# Patient Record
Sex: Female | Born: 1935 | Race: White | Hispanic: No | Smoking: Never smoker
Health system: Southern US, Community
[De-identification: ages and names within clinical notes are randomized; demographics above are authoritative.]

## PROBLEM LIST (undated history)

## (undated) DIAGNOSIS — K219 Gastro-esophageal reflux disease without esophagitis: Secondary | ICD-10-CM

## (undated) DIAGNOSIS — E785 Hyperlipidemia, unspecified: Secondary | ICD-10-CM

## (undated) DIAGNOSIS — M4850XA Collapsed vertebra, not elsewhere classified, site unspecified, initial encounter for fracture: Secondary | ICD-10-CM

## (undated) DIAGNOSIS — E079 Disorder of thyroid, unspecified: Secondary | ICD-10-CM

## (undated) DIAGNOSIS — I10 Essential (primary) hypertension: Secondary | ICD-10-CM

## (undated) DIAGNOSIS — I872 Venous insufficiency (chronic) (peripheral): Secondary | ICD-10-CM

## (undated) HISTORY — DX: Collapsed vertebra, not elsewhere classified, site unspecified, initial encounter for fracture: M48.50XA

## (undated) HISTORY — DX: Disorder of thyroid, unspecified: E07.9

## (undated) HISTORY — DX: Hyperlipidemia, unspecified: E78.5

## (undated) HISTORY — DX: Essential (primary) hypertension: I10

## (undated) HISTORY — DX: Venous insufficiency (chronic) (peripheral): I87.2

## (undated) HISTORY — DX: Gastro-esophageal reflux disease without esophagitis: K21.9

---

## 1998-07-23 ENCOUNTER — Encounter: Payer: Self-pay | Admitting: Family Medicine

## 1998-07-23 LAB — CONVERTED CEMR LAB

## 1998-09-15 ENCOUNTER — Other Ambulatory Visit: Admission: RE | Admit: 1998-09-15 | Discharge: 1998-09-15 | Payer: Self-pay | Admitting: *Deleted

## 2000-01-29 ENCOUNTER — Encounter: Payer: Self-pay | Admitting: Family Medicine

## 2000-01-29 ENCOUNTER — Encounter: Admission: RE | Admit: 2000-01-29 | Discharge: 2000-01-29 | Payer: Self-pay | Admitting: Family Medicine

## 2000-02-06 ENCOUNTER — Encounter: Payer: Self-pay | Admitting: Family Medicine

## 2000-02-06 ENCOUNTER — Encounter: Admission: RE | Admit: 2000-02-06 | Discharge: 2000-02-06 | Payer: Self-pay | Admitting: Family Medicine

## 2000-02-21 ENCOUNTER — Encounter: Payer: Self-pay | Admitting: Family Medicine

## 2000-02-21 ENCOUNTER — Encounter: Admission: RE | Admit: 2000-02-21 | Discharge: 2000-02-21 | Payer: Self-pay | Admitting: Family Medicine

## 2000-02-26 ENCOUNTER — Encounter: Admission: RE | Admit: 2000-02-26 | Discharge: 2000-04-03 | Payer: Self-pay | Admitting: Family Medicine

## 2000-04-02 ENCOUNTER — Encounter: Payer: Self-pay | Admitting: Neurosurgery

## 2000-04-02 ENCOUNTER — Ambulatory Visit (HOSPITAL_COMMUNITY): Admission: RE | Admit: 2000-04-02 | Discharge: 2000-04-02 | Payer: Self-pay | Admitting: Neurosurgery

## 2000-04-16 ENCOUNTER — Encounter: Payer: Self-pay | Admitting: Neurosurgery

## 2000-04-16 ENCOUNTER — Ambulatory Visit (HOSPITAL_COMMUNITY): Admission: RE | Admit: 2000-04-16 | Discharge: 2000-04-16 | Payer: Self-pay | Admitting: Neurosurgery

## 2000-04-27 ENCOUNTER — Encounter: Admission: RE | Admit: 2000-04-27 | Discharge: 2000-04-27 | Payer: Self-pay | Admitting: Internal Medicine

## 2000-04-27 ENCOUNTER — Encounter: Payer: Self-pay | Admitting: Internal Medicine

## 2000-04-30 ENCOUNTER — Encounter: Payer: Self-pay | Admitting: Internal Medicine

## 2000-04-30 ENCOUNTER — Encounter: Admission: RE | Admit: 2000-04-30 | Discharge: 2000-04-30 | Payer: Self-pay | Admitting: Internal Medicine

## 2000-07-23 HISTORY — PX: FRACTURE SURGERY: SHX138

## 2000-08-05 ENCOUNTER — Ambulatory Visit (HOSPITAL_COMMUNITY): Admission: RE | Admit: 2000-08-05 | Discharge: 2000-08-05 | Payer: Self-pay | Admitting: Neurosurgery

## 2000-08-05 ENCOUNTER — Encounter: Payer: Self-pay | Admitting: Neurosurgery

## 2000-08-12 ENCOUNTER — Encounter: Admission: RE | Admit: 2000-08-12 | Discharge: 2000-11-10 | Payer: Self-pay | Admitting: Internal Medicine

## 2000-09-20 DIAGNOSIS — G894 Chronic pain syndrome: Secondary | ICD-10-CM | POA: Insufficient documentation

## 2000-11-26 ENCOUNTER — Encounter: Admission: RE | Admit: 2000-11-26 | Discharge: 2001-02-24 | Payer: Self-pay | Admitting: Internal Medicine

## 2001-02-25 ENCOUNTER — Encounter: Admission: RE | Admit: 2001-02-25 | Discharge: 2001-05-26 | Payer: Self-pay | Admitting: Internal Medicine

## 2001-03-05 ENCOUNTER — Encounter: Payer: Self-pay | Admitting: Emergency Medicine

## 2001-03-05 ENCOUNTER — Inpatient Hospital Stay (HOSPITAL_COMMUNITY): Admission: EM | Admit: 2001-03-05 | Discharge: 2001-03-12 | Payer: Self-pay | Admitting: Emergency Medicine

## 2001-03-06 ENCOUNTER — Encounter: Payer: Self-pay | Admitting: Internal Medicine

## 2001-03-10 ENCOUNTER — Encounter: Payer: Self-pay | Admitting: Orthopedic Surgery

## 2001-03-12 ENCOUNTER — Inpatient Hospital Stay
Admission: RE | Admit: 2001-03-12 | Discharge: 2001-04-01 | Payer: Self-pay | Admitting: Physical Medicine & Rehabilitation

## 2001-03-17 ENCOUNTER — Ambulatory Visit (HOSPITAL_COMMUNITY)
Admission: RE | Admit: 2001-03-17 | Discharge: 2001-03-17 | Payer: Self-pay | Admitting: Physical Medicine & Rehabilitation

## 2001-03-17 ENCOUNTER — Encounter: Payer: Self-pay | Admitting: Orthopedic Surgery

## 2001-05-29 ENCOUNTER — Encounter: Admission: RE | Admit: 2001-05-29 | Discharge: 2001-08-27 | Payer: Self-pay | Admitting: Internal Medicine

## 2001-09-03 ENCOUNTER — Encounter: Admission: RE | Admit: 2001-09-03 | Discharge: 2001-12-02 | Payer: Self-pay | Admitting: Internal Medicine

## 2001-12-02 ENCOUNTER — Encounter: Admission: RE | Admit: 2001-12-02 | Discharge: 2002-03-02 | Payer: Self-pay | Admitting: Internal Medicine

## 2002-08-04 ENCOUNTER — Encounter: Admission: RE | Admit: 2002-08-04 | Discharge: 2002-08-04 | Payer: Self-pay | Admitting: Family Medicine

## 2002-08-04 ENCOUNTER — Encounter: Payer: Self-pay | Admitting: Family Medicine

## 2003-09-21 DIAGNOSIS — H3530 Unspecified macular degeneration: Secondary | ICD-10-CM | POA: Insufficient documentation

## 2004-04-06 ENCOUNTER — Ambulatory Visit (HOSPITAL_COMMUNITY): Admission: RE | Admit: 2004-04-06 | Discharge: 2004-04-06 | Payer: Self-pay | Admitting: General Surgery

## 2004-06-06 ENCOUNTER — Ambulatory Visit: Payer: Self-pay | Admitting: Family Medicine

## 2004-06-20 ENCOUNTER — Ambulatory Visit: Payer: Self-pay | Admitting: Family Medicine

## 2004-11-28 ENCOUNTER — Ambulatory Visit: Payer: Self-pay | Admitting: Family Medicine

## 2004-12-08 ENCOUNTER — Ambulatory Visit: Payer: Self-pay | Admitting: Family Medicine

## 2005-01-24 ENCOUNTER — Ambulatory Visit: Payer: Self-pay | Admitting: Family Medicine

## 2005-02-08 ENCOUNTER — Ambulatory Visit: Payer: Self-pay | Admitting: Family Medicine

## 2005-06-21 ENCOUNTER — Ambulatory Visit: Payer: Self-pay | Admitting: Family Medicine

## 2005-07-30 ENCOUNTER — Ambulatory Visit: Payer: Self-pay | Admitting: Family Medicine

## 2005-08-08 ENCOUNTER — Ambulatory Visit: Payer: Self-pay | Admitting: Family Medicine

## 2005-08-28 ENCOUNTER — Ambulatory Visit: Payer: Self-pay | Admitting: Family Medicine

## 2005-12-04 ENCOUNTER — Ambulatory Visit: Payer: Self-pay | Admitting: Family Medicine

## 2005-12-28 ENCOUNTER — Ambulatory Visit: Payer: Self-pay | Admitting: Family Medicine

## 2006-01-04 ENCOUNTER — Ambulatory Visit: Payer: Self-pay | Admitting: Family Medicine

## 2006-01-08 ENCOUNTER — Encounter (HOSPITAL_COMMUNITY): Admission: RE | Admit: 2006-01-08 | Discharge: 2006-04-03 | Payer: Self-pay | Admitting: General Surgery

## 2006-02-19 ENCOUNTER — Ambulatory Visit: Payer: Self-pay | Admitting: Family Medicine

## 2006-04-15 ENCOUNTER — Ambulatory Visit: Payer: Self-pay | Admitting: Family Medicine

## 2006-04-17 ENCOUNTER — Encounter: Admission: RE | Admit: 2006-04-17 | Discharge: 2006-04-17 | Payer: Self-pay | Admitting: Family Medicine

## 2006-05-08 ENCOUNTER — Ambulatory Visit: Payer: Self-pay | Admitting: Family Medicine

## 2006-05-08 LAB — CONVERTED CEMR LAB: Hgb A1c MFr Bld: 6.7 %

## 2006-06-04 ENCOUNTER — Ambulatory Visit: Payer: Self-pay | Admitting: Family Medicine

## 2006-08-20 ENCOUNTER — Ambulatory Visit: Payer: Self-pay | Admitting: Family Medicine

## 2006-08-27 ENCOUNTER — Ambulatory Visit: Payer: Self-pay | Admitting: Family Medicine

## 2006-08-28 ENCOUNTER — Encounter: Admission: RE | Admit: 2006-08-28 | Discharge: 2006-08-28 | Payer: Self-pay | Admitting: Family Medicine

## 2006-08-28 LAB — CONVERTED CEMR LAB
Albumin: 3.6 g/dL
BUN: 21 mg/dL
CO2: 32 meq/L
Calcium: 11 mg/dL — ABNORMAL HIGH
Chloride: 103 meq/L
Creatinine, Ser: 0.9 mg/dL
GFR calc Af Amer: 77 mL/min
GFR calc non Af Amer: 64 mL/min
Glucose, Bld: 86 mg/dL
Phosphorus: 3.2 mg/dL
Potassium: 4.7 meq/L
Sodium: 141 meq/L

## 2006-09-04 ENCOUNTER — Ambulatory Visit: Payer: Self-pay | Admitting: Family Medicine

## 2006-09-20 ENCOUNTER — Ambulatory Visit: Payer: Self-pay | Admitting: Family Medicine

## 2006-09-23 ENCOUNTER — Encounter: Admission: RE | Admit: 2006-09-23 | Discharge: 2006-09-23 | Payer: Self-pay | Admitting: Family Medicine

## 2006-09-30 ENCOUNTER — Ambulatory Visit: Payer: Self-pay | Admitting: Family Medicine

## 2006-10-07 ENCOUNTER — Encounter: Payer: Self-pay | Admitting: Family Medicine

## 2006-10-07 DIAGNOSIS — Z8639 Personal history of other endocrine, nutritional and metabolic disease: Secondary | ICD-10-CM | POA: Insufficient documentation

## 2006-10-07 DIAGNOSIS — E785 Hyperlipidemia, unspecified: Secondary | ICD-10-CM

## 2006-10-07 DIAGNOSIS — Z862 Personal history of diseases of the blood and blood-forming organs and certain disorders involving the immune mechanism: Secondary | ICD-10-CM

## 2006-10-07 DIAGNOSIS — I10 Essential (primary) hypertension: Secondary | ICD-10-CM | POA: Insufficient documentation

## 2006-10-07 DIAGNOSIS — K219 Gastro-esophageal reflux disease without esophagitis: Secondary | ICD-10-CM | POA: Insufficient documentation

## 2006-10-07 DIAGNOSIS — E109 Type 1 diabetes mellitus without complications: Secondary | ICD-10-CM | POA: Insufficient documentation

## 2006-10-08 ENCOUNTER — Ambulatory Visit: Payer: Self-pay | Admitting: Family Medicine

## 2006-10-08 LAB — CONVERTED CEMR LAB
AST: 25 units/L (ref 0–37)
Calcium: 10.7 mg/dL — ABNORMAL HIGH (ref 8.4–10.5)
Hgb A1c MFr Bld: 7.1 % — ABNORMAL HIGH (ref 4.6–6.0)
Total CHOL/HDL Ratio: 2.6
VLDL: 13 mg/dL (ref 0–40)

## 2006-12-11 ENCOUNTER — Encounter: Payer: Self-pay | Admitting: Family Medicine

## 2006-12-31 ENCOUNTER — Ambulatory Visit: Payer: Self-pay | Admitting: Family Medicine

## 2006-12-31 DIAGNOSIS — IMO0002 Reserved for concepts with insufficient information to code with codable children: Secondary | ICD-10-CM

## 2006-12-31 DIAGNOSIS — M810 Age-related osteoporosis without current pathological fracture: Secondary | ICD-10-CM | POA: Insufficient documentation

## 2007-01-02 LAB — CONVERTED CEMR LAB: Hgb A1c MFr Bld: 6.5 % — ABNORMAL HIGH (ref 4.6–6.0)

## 2007-01-07 ENCOUNTER — Telehealth (INDEPENDENT_AMBULATORY_CARE_PROVIDER_SITE_OTHER): Payer: Self-pay | Admitting: *Deleted

## 2007-01-10 ENCOUNTER — Telehealth (INDEPENDENT_AMBULATORY_CARE_PROVIDER_SITE_OTHER): Payer: Self-pay | Admitting: Internal Medicine

## 2007-01-10 ENCOUNTER — Ambulatory Visit: Payer: Self-pay | Admitting: Internal Medicine

## 2007-01-10 LAB — CONVERTED CEMR LAB
Epithelial cells, urine: 0 /lpf
Nitrite: NEGATIVE
Protein, U semiquant: NEGATIVE
pH: 6

## 2007-03-28 ENCOUNTER — Telehealth (INDEPENDENT_AMBULATORY_CARE_PROVIDER_SITE_OTHER): Payer: Self-pay | Admitting: *Deleted

## 2007-04-03 ENCOUNTER — Ambulatory Visit: Payer: Self-pay | Admitting: Family Medicine

## 2007-04-04 LAB — CONVERTED CEMR LAB
ALT: 19 units/L (ref 0–35)
Albumin: 3.9 g/dL (ref 3.5–5.2)
BUN: 17 mg/dL (ref 6–23)
CO2: 31 meq/L (ref 19–32)
Calcium: 10.9 mg/dL — ABNORMAL HIGH (ref 8.4–10.5)
Chloride: 105 meq/L (ref 96–112)
Creatinine, Ser: 0.7 mg/dL (ref 0.4–1.2)
Creatinine,U: 53.9 mg/dL
GFR calc Af Amer: 103 mL/min
Microalb Creat Ratio: 13 mg/g (ref 0.0–30.0)
Microalb, Ur: 0.7 mg/dL (ref 0.0–1.9)
Phosphorus: 3.4 mg/dL (ref 2.3–4.6)
Potassium: 4.3 meq/L (ref 3.5–5.1)
Sodium: 141 meq/L (ref 135–145)

## 2007-07-10 ENCOUNTER — Encounter: Payer: Self-pay | Admitting: Family Medicine

## 2007-08-05 ENCOUNTER — Ambulatory Visit: Payer: Self-pay | Admitting: Family Medicine

## 2007-08-06 ENCOUNTER — Telehealth: Payer: Self-pay | Admitting: Family Medicine

## 2007-08-14 ENCOUNTER — Ambulatory Visit: Payer: Self-pay | Admitting: Family Medicine

## 2007-08-15 LAB — CONVERTED CEMR LAB
ALT: 20 units/L (ref 0–35)
Albumin: 4.1 g/dL (ref 3.5–5.2)
Basophils Absolute: 0 10*3/uL (ref 0.0–0.1)
CO2: 31 meq/L (ref 19–32)
Calcium: 11.2 mg/dL — ABNORMAL HIGH (ref 8.4–10.5)
Chloride: 103 meq/L (ref 96–112)
Cholesterol: 149 mg/dL (ref 0–200)
Eosinophils Relative: 2.1 % (ref 0.0–5.0)
GFR calc Af Amer: 103 mL/min
GFR calc non Af Amer: 85 mL/min
Glucose, Bld: 148 mg/dL — ABNORMAL HIGH (ref 70–99)
Hemoglobin: 12.9 g/dL (ref 12.0–15.0)
Hgb A1c MFr Bld: 6.8 % — ABNORMAL HIGH (ref 4.6–6.0)
MCHC: 35.1 g/dL (ref 30.0–36.0)
MCV: 95.6 fL (ref 78.0–100.0)
Neutrophils Relative %: 73.5 % (ref 43.0–77.0)
Sodium: 142 meq/L (ref 135–145)
VLDL: 14 mg/dL (ref 0–40)

## 2007-09-09 ENCOUNTER — Ambulatory Visit: Payer: Self-pay | Admitting: Family Medicine

## 2007-09-24 ENCOUNTER — Telehealth: Payer: Self-pay | Admitting: Family Medicine

## 2007-10-08 ENCOUNTER — Encounter: Payer: Self-pay | Admitting: Family Medicine

## 2007-11-05 ENCOUNTER — Ambulatory Visit: Payer: Self-pay | Admitting: Family Medicine

## 2007-12-03 ENCOUNTER — Telehealth: Payer: Self-pay | Admitting: Family Medicine

## 2007-12-03 ENCOUNTER — Encounter: Admission: RE | Admit: 2007-12-03 | Discharge: 2007-12-03 | Payer: Self-pay | Admitting: Family Medicine

## 2008-01-05 ENCOUNTER — Ambulatory Visit: Payer: Self-pay | Admitting: Family Medicine

## 2008-01-05 DIAGNOSIS — S62109A Fracture of unspecified carpal bone, unspecified wrist, initial encounter for closed fracture: Secondary | ICD-10-CM | POA: Insufficient documentation

## 2008-01-06 LAB — CONVERTED CEMR LAB
ALT: 17 units/L (ref 0–35)
Hgb A1c MFr Bld: 7.1 % — ABNORMAL HIGH (ref 4.6–6.0)

## 2008-01-19 ENCOUNTER — Ambulatory Visit: Payer: Self-pay | Admitting: Family Medicine

## 2008-03-30 ENCOUNTER — Encounter: Payer: Self-pay | Admitting: Family Medicine

## 2008-04-01 ENCOUNTER — Encounter: Payer: Self-pay | Admitting: Family Medicine

## 2008-04-07 ENCOUNTER — Ambulatory Visit: Payer: Self-pay | Admitting: Family Medicine

## 2008-04-12 LAB — CONVERTED CEMR LAB
Albumin: 3.8 g/dL (ref 3.5–5.2)
CO2: 33 meq/L — ABNORMAL HIGH (ref 19–32)
Creatinine,U: 11.7 mg/dL
GFR calc non Af Amer: 85 mL/min
Microalb, Ur: 0.6 mg/dL (ref 0.0–1.9)
Potassium: 3.5 meq/L (ref 3.5–5.1)
Sodium: 139 meq/L (ref 135–145)

## 2008-04-14 ENCOUNTER — Encounter: Payer: Self-pay | Admitting: Family Medicine

## 2008-05-11 ENCOUNTER — Ambulatory Visit: Payer: Self-pay | Admitting: Family Medicine

## 2008-05-13 ENCOUNTER — Encounter: Admission: RE | Admit: 2008-05-13 | Discharge: 2008-05-13 | Payer: Self-pay | Admitting: Family Medicine

## 2008-06-04 ENCOUNTER — Ambulatory Visit: Payer: Self-pay | Admitting: Family Medicine

## 2008-06-14 ENCOUNTER — Telehealth: Payer: Self-pay | Admitting: Family Medicine

## 2008-09-27 ENCOUNTER — Ambulatory Visit: Payer: Self-pay | Admitting: Family Medicine

## 2008-09-29 ENCOUNTER — Ambulatory Visit: Payer: Self-pay | Admitting: Cardiovascular Disease

## 2008-09-30 LAB — CONVERTED CEMR LAB
Alkaline Phosphatase: 60 units/L (ref 39–117)
BUN: 14 mg/dL (ref 6–23)
Calcium: 10.5 mg/dL (ref 8.4–10.5)
Chloride: 102 meq/L (ref 96–112)
Eosinophils Relative: 1.3 % (ref 0.0–5.0)
GFR calc Af Amer: 122 mL/min
Glucose, Bld: 225 mg/dL — ABNORMAL HIGH (ref 70–99)
Hemoglobin: 11.6 g/dL — ABNORMAL LOW (ref 12.0–15.0)
MCV: 91 fL (ref 78.0–100.0)
Monocytes Absolute: 0.2 10*3/uL (ref 0.1–1.0)
Monocytes Relative: 3 % (ref 3.0–12.0)
Neutro Abs: 6 10*3/uL (ref 1.4–7.7)
Potassium: 3.8 meq/L (ref 3.5–5.1)
RDW: 13.8 % (ref 11.5–14.6)
Total Bilirubin: 0.6 mg/dL (ref 0.3–1.2)
WBC: 7.7 10*3/uL (ref 4.5–10.5)

## 2008-10-04 ENCOUNTER — Encounter: Payer: Self-pay | Admitting: Family Medicine

## 2008-10-19 ENCOUNTER — Ambulatory Visit: Payer: Self-pay | Admitting: Family Medicine

## 2008-10-19 LAB — CONVERTED CEMR LAB: OCCULT 1: NEGATIVE

## 2008-10-21 ENCOUNTER — Encounter (INDEPENDENT_AMBULATORY_CARE_PROVIDER_SITE_OTHER): Payer: Self-pay | Admitting: *Deleted

## 2008-10-25 ENCOUNTER — Telehealth: Payer: Self-pay | Admitting: Family Medicine

## 2008-11-30 ENCOUNTER — Ambulatory Visit: Payer: Self-pay | Admitting: Gastroenterology

## 2008-11-30 DIAGNOSIS — K5900 Constipation, unspecified: Secondary | ICD-10-CM | POA: Insufficient documentation

## 2008-11-30 DIAGNOSIS — K902 Blind loop syndrome, not elsewhere classified: Secondary | ICD-10-CM

## 2008-12-01 LAB — CONVERTED CEMR LAB
Ferritin: 51.7 ng/mL (ref 10.0–291.0)
Folate: >20 ng/mL
Iron: 45 ug/dL (ref 42–145)
Saturation Ratios: 14.1 % — ABNORMAL LOW (ref 20.0–50.0)
Transferrin: 228.5 mg/dL (ref 212.0–360.0)
Vitamin B-12: >1500 pg/mL — ABNORMAL HIGH (ref 211–911)

## 2008-12-10 ENCOUNTER — Encounter: Payer: Self-pay | Admitting: Family Medicine

## 2008-12-23 ENCOUNTER — Telehealth: Payer: Self-pay | Admitting: Gastroenterology

## 2009-01-17 ENCOUNTER — Ambulatory Visit: Payer: Self-pay | Admitting: Family Medicine

## 2009-01-17 DIAGNOSIS — Z8669 Personal history of other diseases of the nervous system and sense organs: Secondary | ICD-10-CM | POA: Insufficient documentation

## 2009-01-17 DIAGNOSIS — I872 Venous insufficiency (chronic) (peripheral): Secondary | ICD-10-CM | POA: Insufficient documentation

## 2009-01-21 ENCOUNTER — Telehealth: Payer: Self-pay | Admitting: Family Medicine

## 2009-02-14 ENCOUNTER — Ambulatory Visit: Payer: Self-pay | Admitting: Family Medicine

## 2009-02-14 DIAGNOSIS — R609 Edema, unspecified: Secondary | ICD-10-CM

## 2009-02-15 ENCOUNTER — Telehealth: Payer: Self-pay | Admitting: Family Medicine

## 2009-02-15 ENCOUNTER — Encounter: Admission: RE | Admit: 2009-02-15 | Discharge: 2009-02-15 | Payer: Self-pay | Admitting: Family Medicine

## 2009-02-25 ENCOUNTER — Ambulatory Visit: Payer: Self-pay | Admitting: Family Medicine

## 2009-03-03 ENCOUNTER — Encounter: Payer: Self-pay | Admitting: Family Medicine

## 2009-03-07 ENCOUNTER — Encounter: Admission: RE | Admit: 2009-03-07 | Discharge: 2009-03-07 | Payer: Self-pay | Admitting: Family Medicine

## 2009-03-10 ENCOUNTER — Encounter: Payer: Self-pay | Admitting: Family Medicine

## 2009-03-22 ENCOUNTER — Ambulatory Visit: Payer: Self-pay | Admitting: Family Medicine

## 2009-04-08 ENCOUNTER — Ambulatory Visit: Payer: Self-pay | Admitting: Family Medicine

## 2009-04-11 LAB — CONVERTED CEMR LAB
ALT: 17 units/L (ref 0–35)
AST: 22 units/L (ref 0–37)
Albumin: 3.7 g/dL (ref 3.5–5.2)
Alkaline Phosphatase: 70 units/L (ref 39–117)
BUN: 13 mg/dL (ref 6–23)
Basophils Absolute: 0 10*3/uL (ref 0.0–0.1)
CO2: 35 meq/L — ABNORMAL HIGH (ref 19–32)
Calcium: 11.1 mg/dL — ABNORMAL HIGH (ref 8.4–10.5)
Chloride: 107 meq/L (ref 96–112)
Cholesterol: 128 mg/dL (ref 0–200)
HCT: 35 % — ABNORMAL LOW (ref 36.0–46.0)
Hemoglobin: 11.8 g/dL — ABNORMAL LOW (ref 12.0–15.0)
Hgb A1c MFr Bld: 7.3 % — ABNORMAL HIGH (ref 4.6–6.5)
Neutro Abs: 5.1 10*3/uL (ref 1.4–7.7)
Neutrophils Relative %: 69.3 % (ref 43.0–77.0)
Platelets: 158 10*3/uL (ref 150.0–400.0)
Total Bilirubin: 0.8 mg/dL (ref 0.3–1.2)
Total CHOL/HDL Ratio: 2
VLDL: 9.4 mg/dL (ref 0.0–40.0)
WBC: 7.3 10*3/uL (ref 4.5–10.5)

## 2009-04-13 LAB — CONVERTED CEMR LAB
Creatinine,U: 18.6 mg/dL
Microalb, Ur: 0.1 mg/dL (ref 0.0–1.9)

## 2009-05-09 ENCOUNTER — Ambulatory Visit: Payer: Self-pay | Admitting: Family Medicine

## 2009-05-16 ENCOUNTER — Telehealth: Payer: Self-pay | Admitting: Family Medicine

## 2009-06-08 ENCOUNTER — Encounter: Payer: Self-pay | Admitting: Family Medicine

## 2009-06-13 ENCOUNTER — Ambulatory Visit: Payer: Self-pay | Admitting: Family Medicine

## 2009-06-15 ENCOUNTER — Telehealth: Payer: Self-pay | Admitting: Family Medicine

## 2009-06-28 ENCOUNTER — Ambulatory Visit: Payer: Self-pay | Admitting: Family Medicine

## 2009-06-29 ENCOUNTER — Telehealth: Payer: Self-pay | Admitting: Family Medicine

## 2009-07-01 ENCOUNTER — Ambulatory Visit: Payer: Self-pay | Admitting: Family Medicine

## 2009-07-01 LAB — CONVERTED CEMR LAB
Albumin: 3.6 g/dL (ref 3.5–5.2)
BUN: 12 mg/dL (ref 6–23)
CO2: 34 meq/L — ABNORMAL HIGH (ref 19–32)
Chloride: 101 meq/L (ref 96–112)
Creatinine, Ser: 0.8 mg/dL (ref 0.4–1.2)
GFR calc non Af Amer: 72.16 mL/min (ref >60)
Potassium: 2.8 meq/L — CL (ref 3.5–5.1)

## 2009-09-15 ENCOUNTER — Telehealth: Payer: Self-pay | Admitting: Family Medicine

## 2009-10-14 ENCOUNTER — Ambulatory Visit: Payer: Self-pay | Admitting: Family Medicine

## 2009-10-17 LAB — CONVERTED CEMR LAB
Ferritin: 75 ng/mL (ref 10–291)
Iron: 34 ug/dL — ABNORMAL LOW (ref 42–145)
Lymphocytes Relative: 21 % (ref 12–46)
MCHC: 32.1 g/dL (ref 30.0–36.0)
Platelets: 180 10*3/uL (ref 150–400)
RDW: 14.3 % (ref 11.5–15.5)
WBC: 8 10*3/uL (ref 4.0–10.5)

## 2009-12-28 ENCOUNTER — Ambulatory Visit: Payer: Self-pay | Admitting: Family Medicine

## 2009-12-29 LAB — CONVERTED CEMR LAB: Vit D, 25-Hydroxy: 34 ng/mL (ref 30–89)

## 2010-02-02 ENCOUNTER — Encounter: Payer: Self-pay | Admitting: Family Medicine

## 2010-02-02 LAB — CONVERTED CEMR LAB
ALT: 21 units/L (ref 0–35)
AST: 29 units/L (ref 0–37)
Albumin: 3.9 g/dL (ref 3.5–5.2)
BUN: 17 mg/dL (ref 6–23)
Basophils Absolute: 0 10*3/uL (ref 0.0–0.1)
CO2: 29 meq/L (ref 19–32)
Calcium: 11 mg/dL — ABNORMAL HIGH (ref 8.4–10.5)
Creatinine, Ser: 0.8 mg/dL (ref 0.4–1.2)
Eosinophils Absolute: 0.1 10*3/uL (ref 0.0–0.7)
Eosinophils Relative: 1.3 % (ref 0.0–5.0)
Glucose, Bld: 126 mg/dL — ABNORMAL HIGH (ref 70–99)
HCT: 34.4 % — ABNORMAL LOW (ref 36.0–46.0)
HDL: 57.6 mg/dL (ref >39.00)
Lymphs Abs: 1.3 10*3/uL (ref 0.7–4.0)
MCHC: 33.9 g/dL (ref 30.0–36.0)
MCV: 92.2 fL (ref 78.0–100.0)
Monocytes Absolute: 0.5 10*3/uL (ref 0.1–1.0)
Neutrophils Relative %: 72.1 % (ref 43.0–77.0)
Platelets: 152 10*3/uL (ref 150.0–400.0)
RDW: 15.3 % — ABNORMAL HIGH (ref 11.5–14.6)
Sodium: 144 meq/L (ref 135–145)
Triglycerides: 43 mg/dL (ref 0.0–149.0)
VLDL: 8.6 mg/dL (ref 0.0–40.0)

## 2010-03-15 IMAGING — US US EXTREM LOW VENOUS*L*
1 series · 14 of 24 positions shown · non-contrast
Comparison: None

CLINICAL DATA: Left calf pain.  Venous insufficiency.

LEFT LOWER EXTREMITY VENOUS DUPLEX ULTRASOUND
TECHNIQUE: Gray-scale sonography with graded compression, as well
as color Doppler and duplex ultrasound, were performed to evaluate
the deep venous system of the lower extremity on the left side from
the level of the common femoral vein through the popliteal and
proximal calf veins. Spectral Doppler was utilized to evaluate flow
at rest and with distal augmentation maneuvers.

[Series 1: us extrem low venous*left* · 14 of 34 slices shown]
[im 1/34]
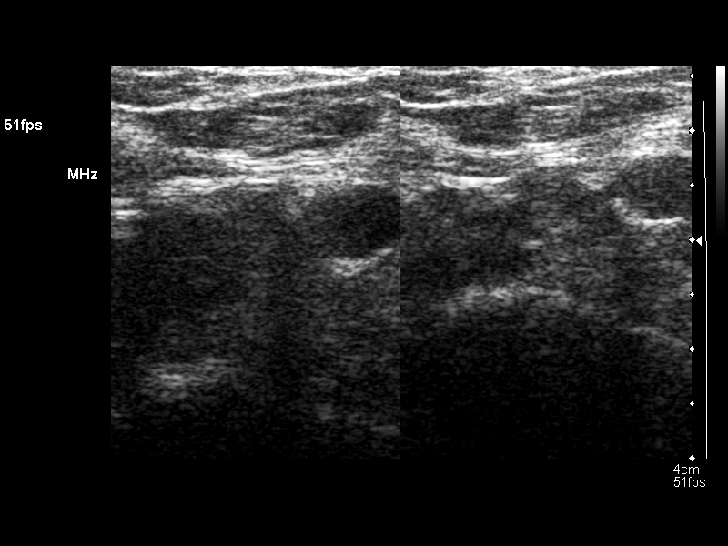
[im 3/34]
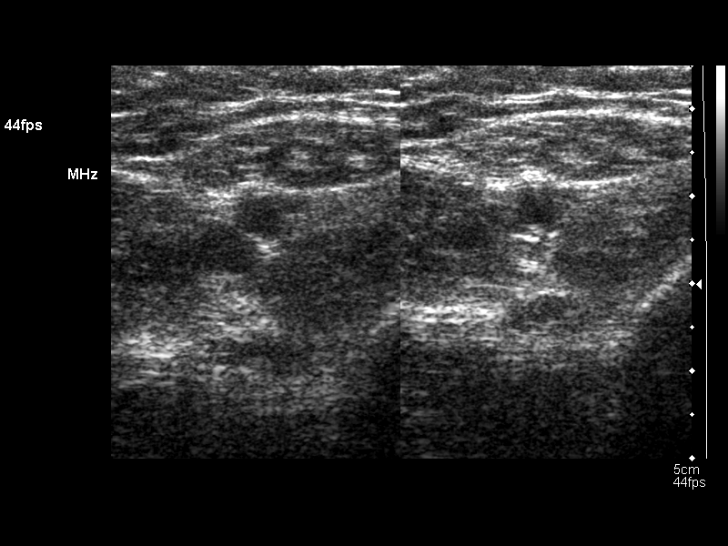
[im 6/34]
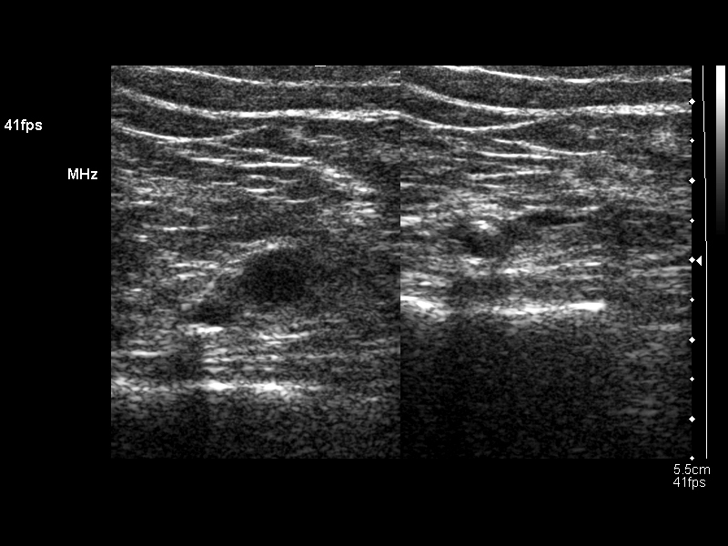
[im 9/34]
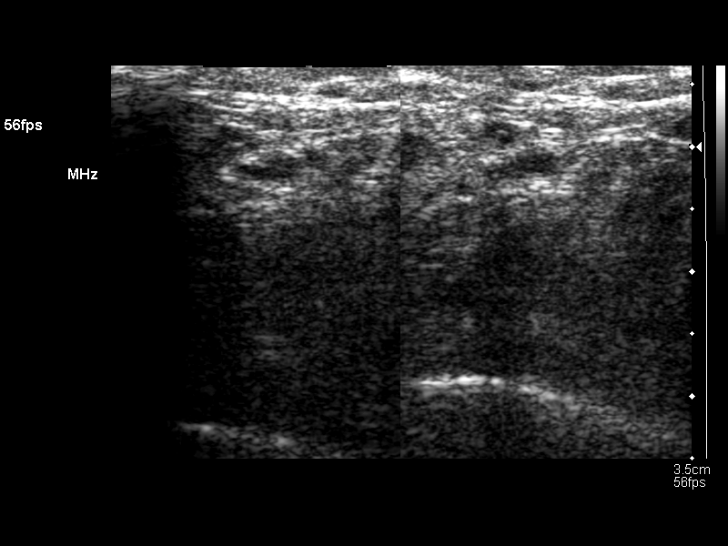
[im 11/34]
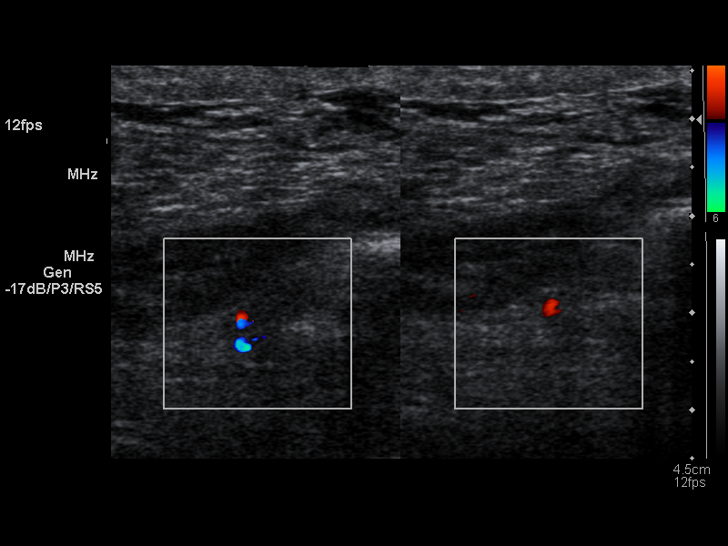
[im 13/34]
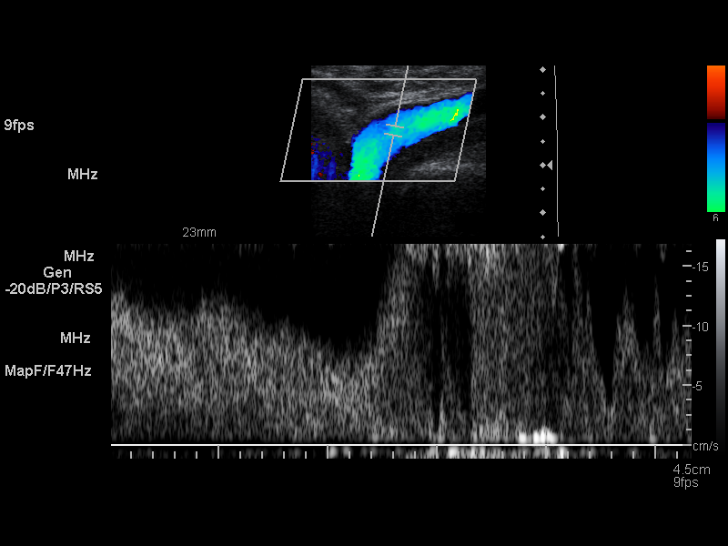
[im 16/34]
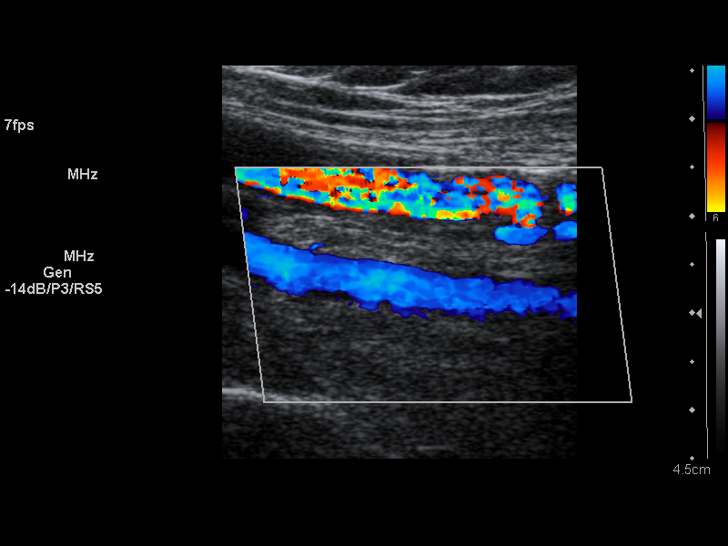
[im 18/34]
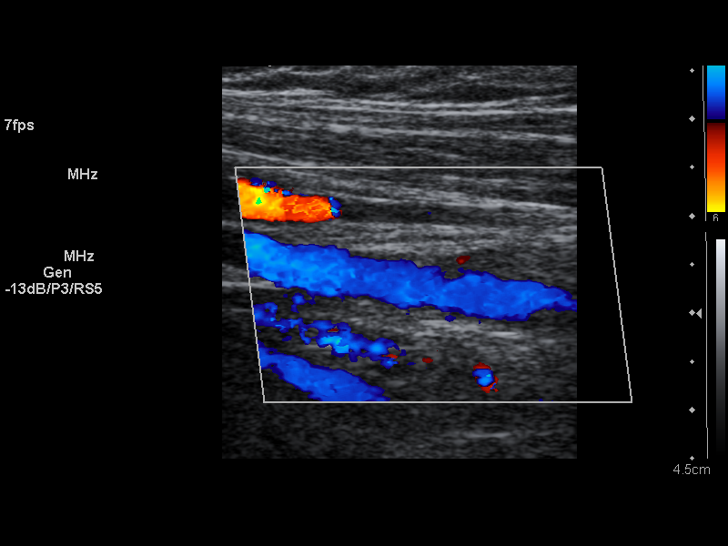
[im 21/34]
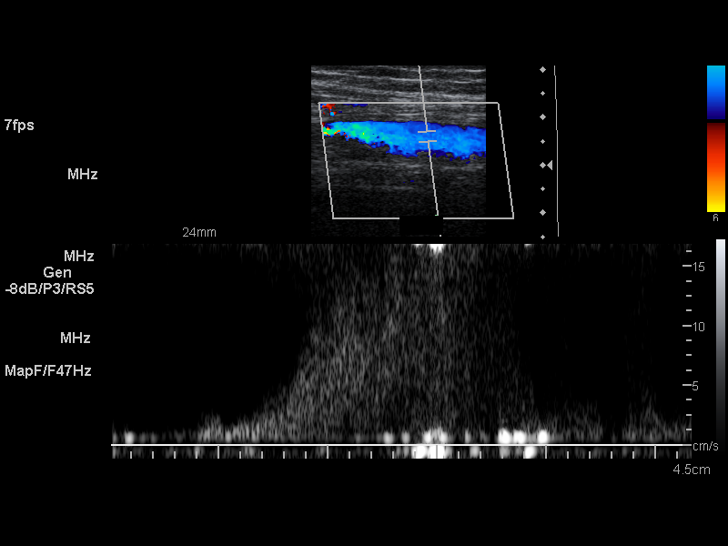
[im 23/34]
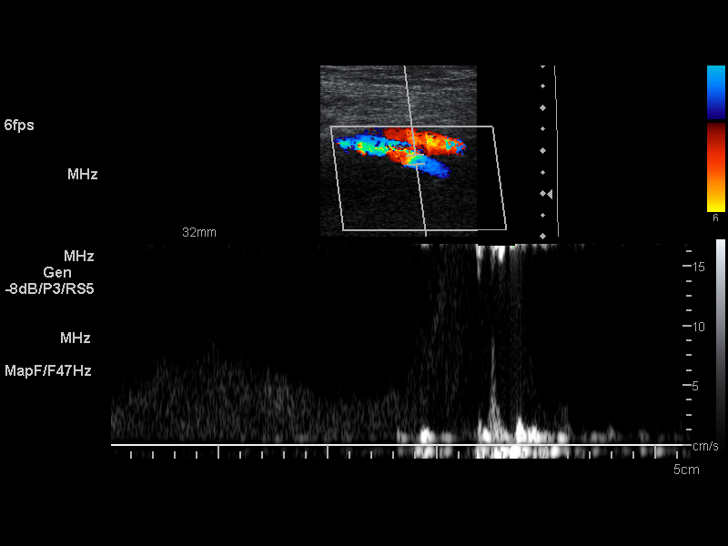
[im 26/34]
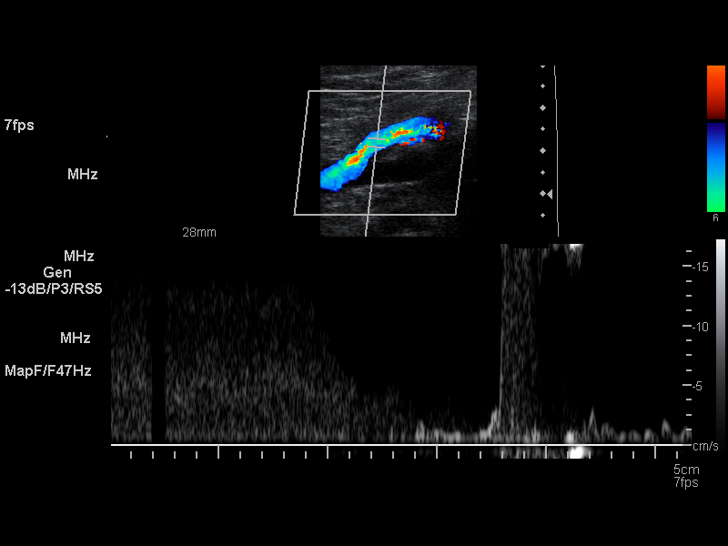
[im 28/34]
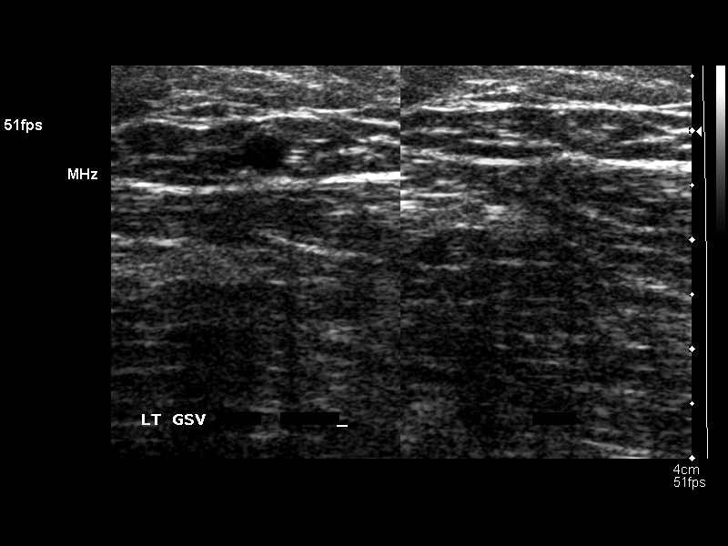
[im 31/34]
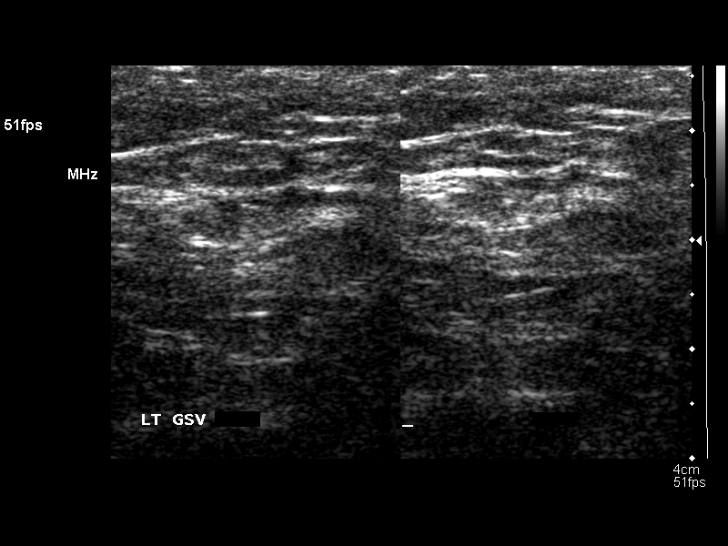
[im 34/34]
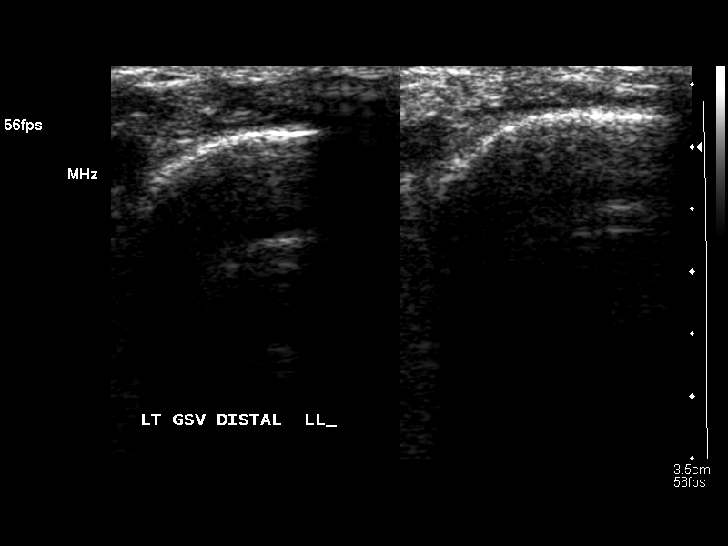

[14 of 24 positions shown; findings below may reference images not displayed]

FINDINGS: Normal compressibility of the left common femoral,
superficial femoral, and popliteal veins is demonstrated, as well
as the visualized proximal calf veins.  No filling defects to
suggest DVT on grayscale or color Doppler imaging.  Doppler
waveforms show normal direction of venous flow, normal respiratory
phasicity and response to augmentation.
IMPRESSION: 1.  No evidence of lower extremity deep vein thrombosis.

## 2010-03-29 ENCOUNTER — Encounter: Payer: Self-pay | Admitting: Family Medicine

## 2010-03-31 ENCOUNTER — Ambulatory Visit: Payer: Self-pay | Admitting: Family Medicine

## 2010-04-04 IMAGING — CR DG TIBIA/FIBULA 2V*L*
2 series · 2 of 2 positions shown · non-contrast
Comparison: None.

CLINICAL DATA: Edema and erythema involving the left leg and foot.
History of diabetes.

LEFT TIBIA AND FIBULA - 2 VIEW 03/07/2009:

[view not recorded (1 of 2)]
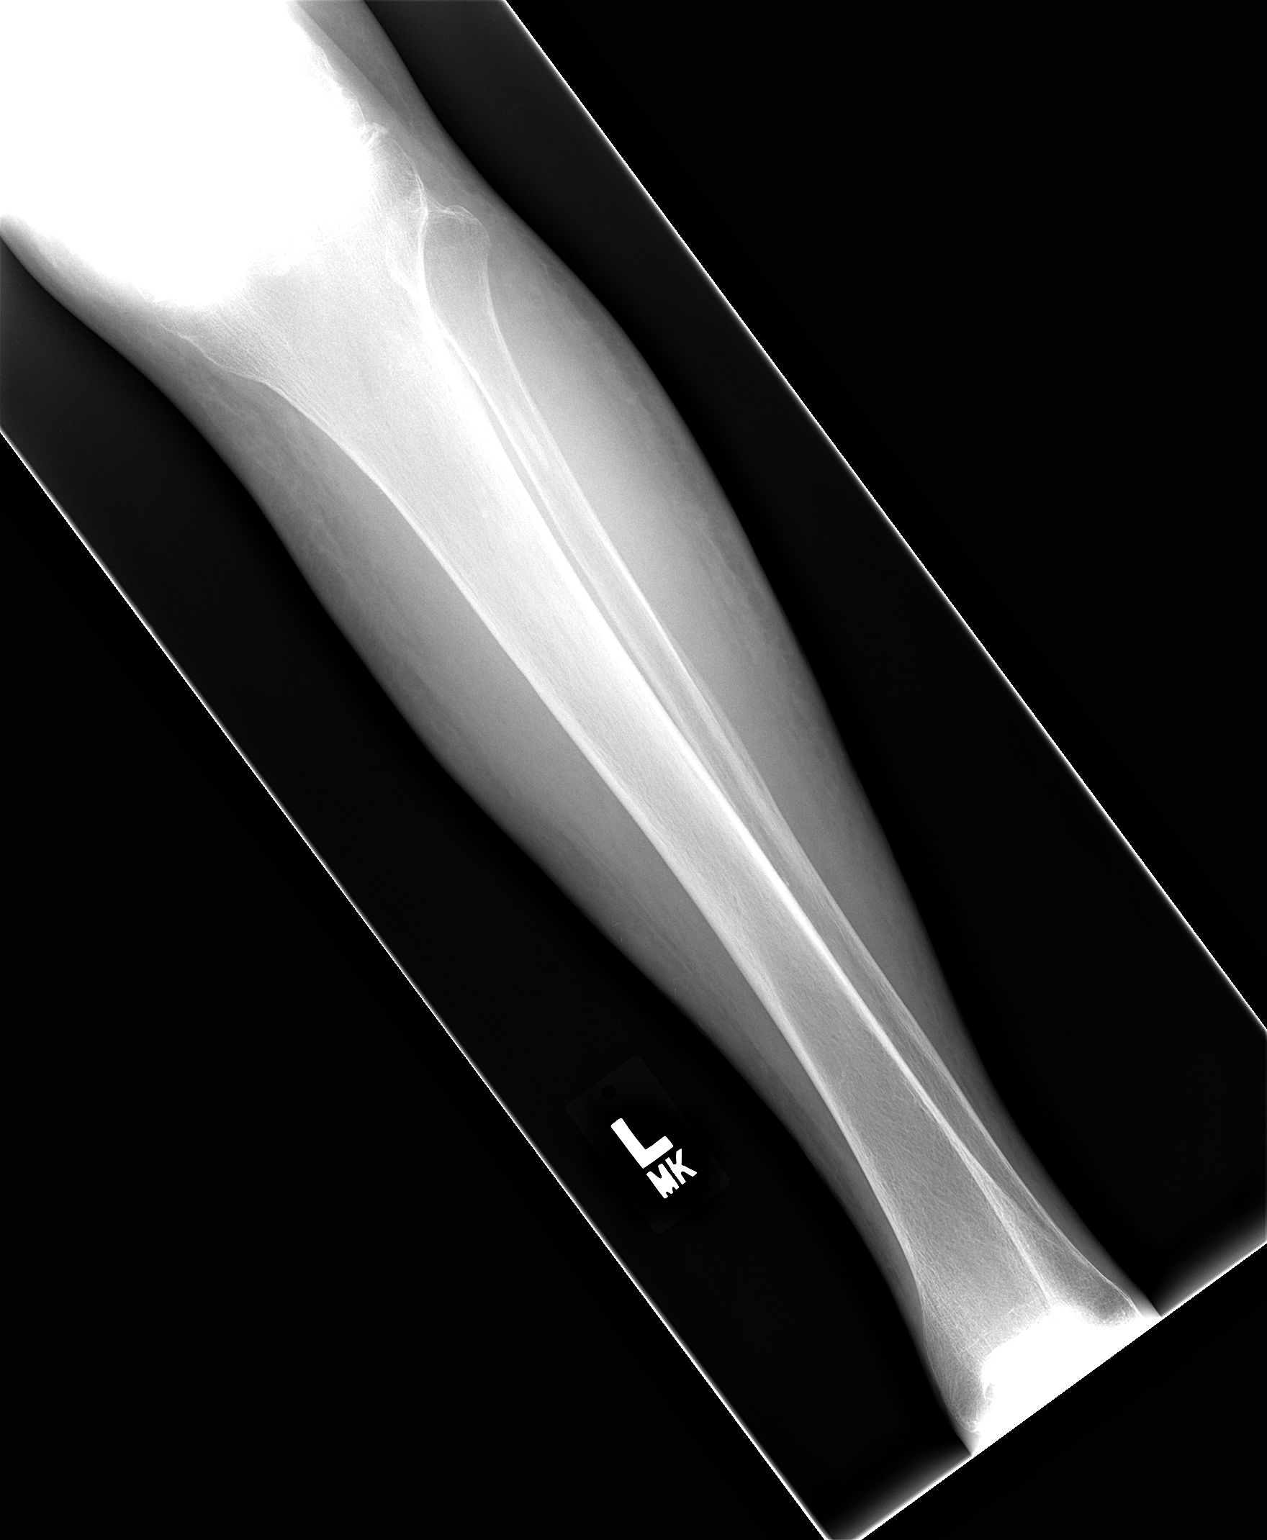

[view not recorded (2 of 2)]
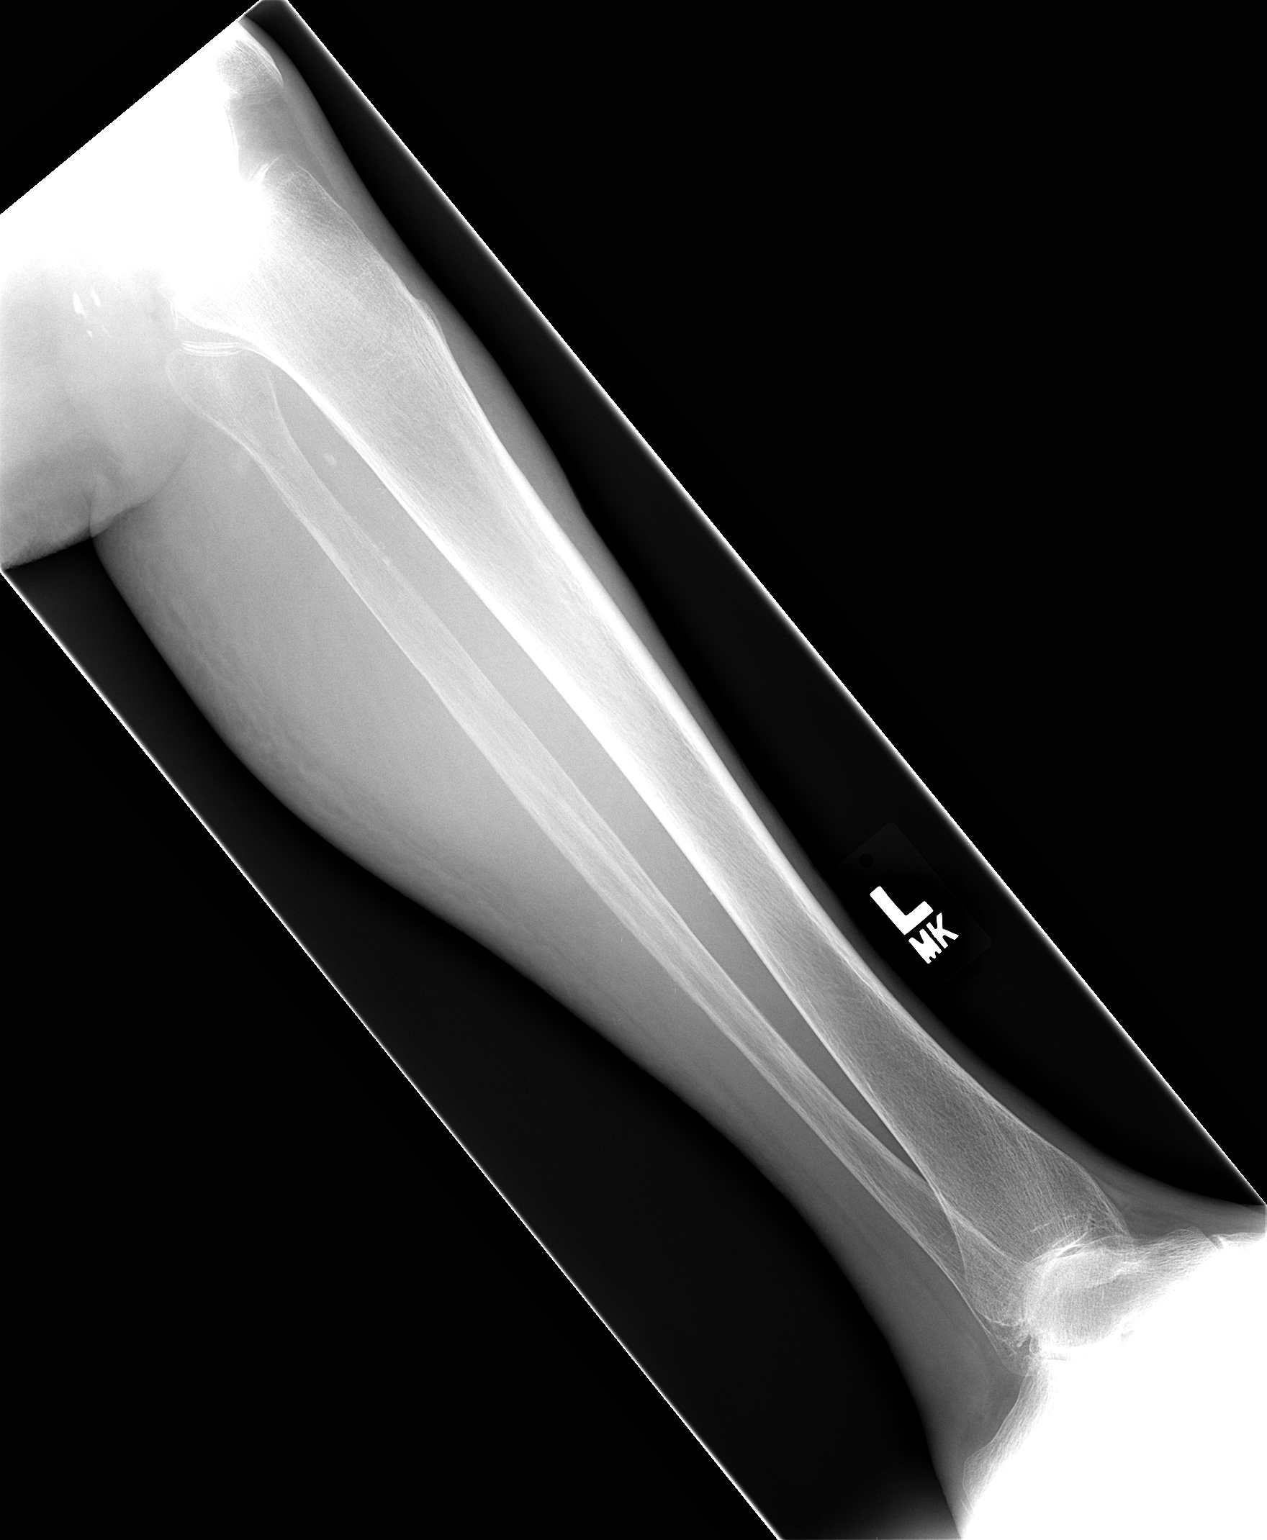

[2 of 2 positions shown; findings below may reference images not displayed]

FINDINGS: Severe generalized osteopenia.  No evidence of acute or
subacute fracture or dislocation.  No other intrinsic osseous
abnormalities.  Phleboliths in the soft tissues.  Moderate to
severe degenerative changes involving the visualized knee joint.
IMPRESSION: No acute skeletal abnormalities.  Severe osteopenia.  Degenerative
changes in the knee joint.

## 2010-04-04 IMAGING — CR DG ANKLE COMPLETE 3+V*L*
3 series · 3 of 3 positions shown · non-contrast
Comparison: None.

CLINICAL DATA: Edema and erythema involving the left leg and foot.
History of diabetes.

LEFT ANKLE COMPLETE - 3+ VIEW 03/07/2009:

[view not recorded (1 of 3)]
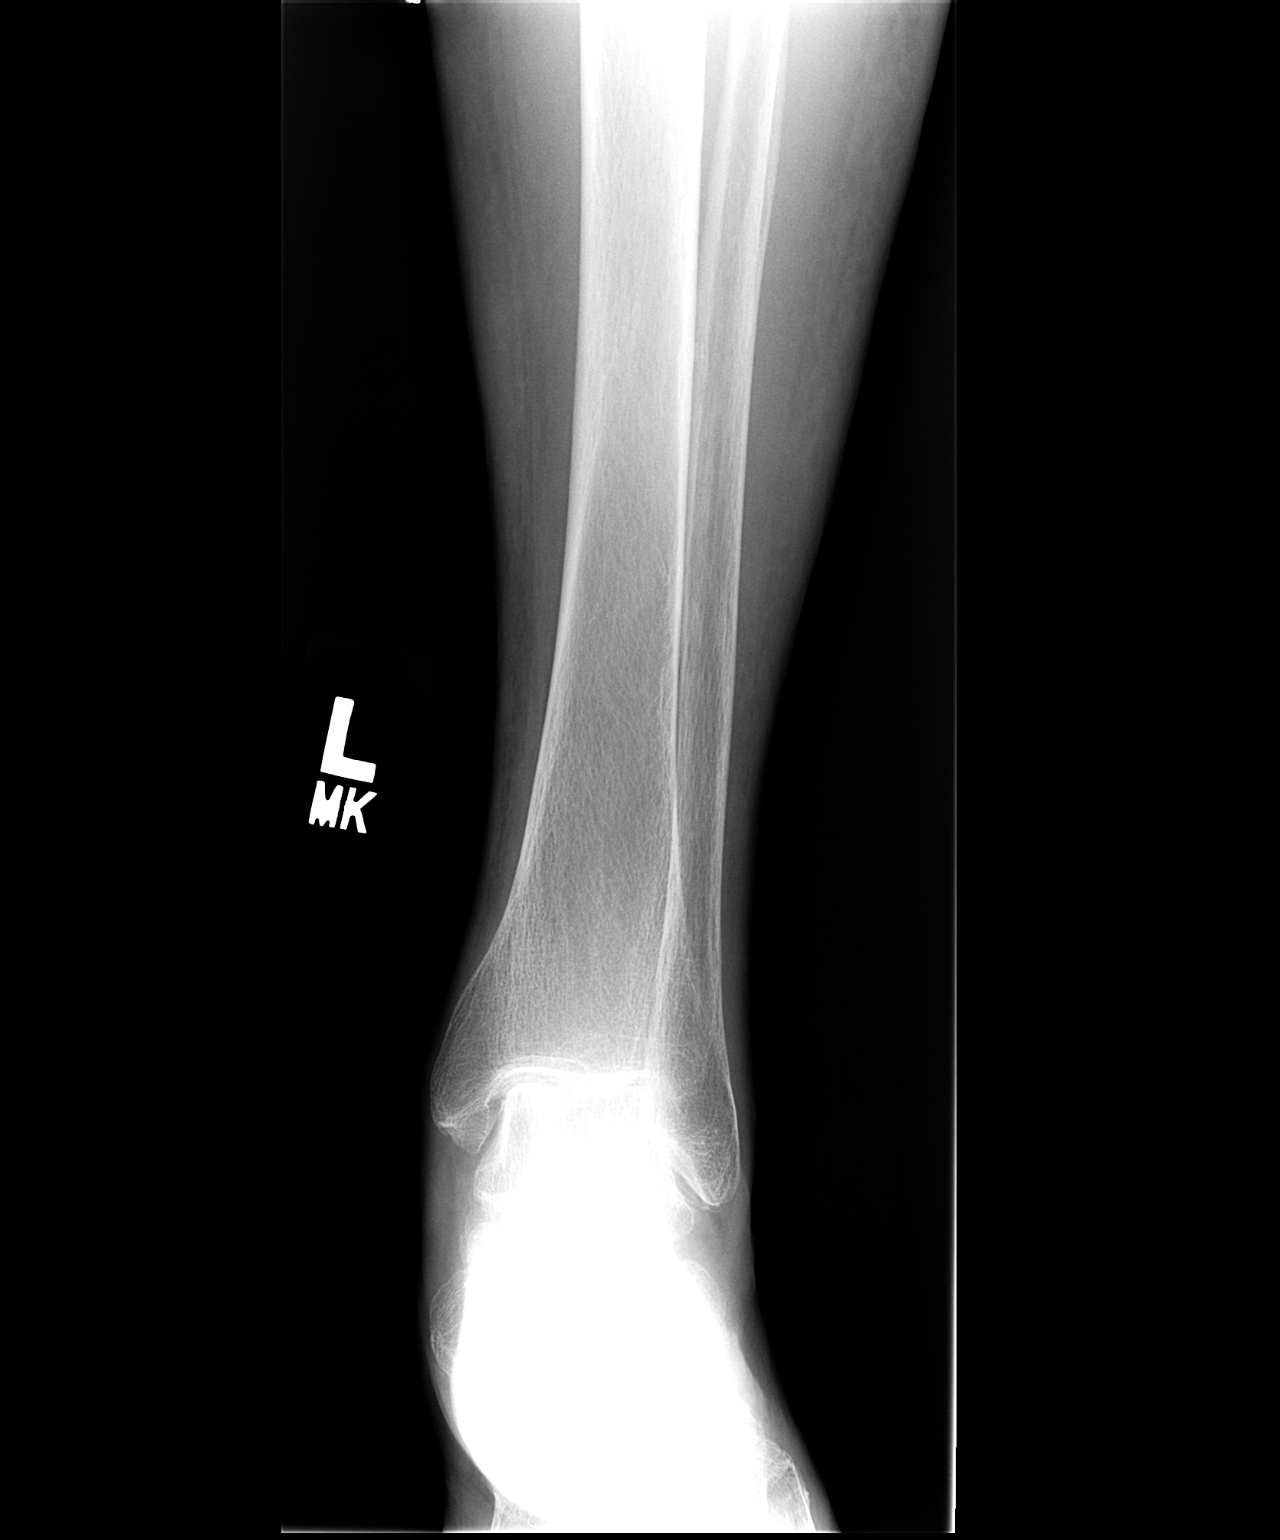

[view not recorded (2 of 3)]
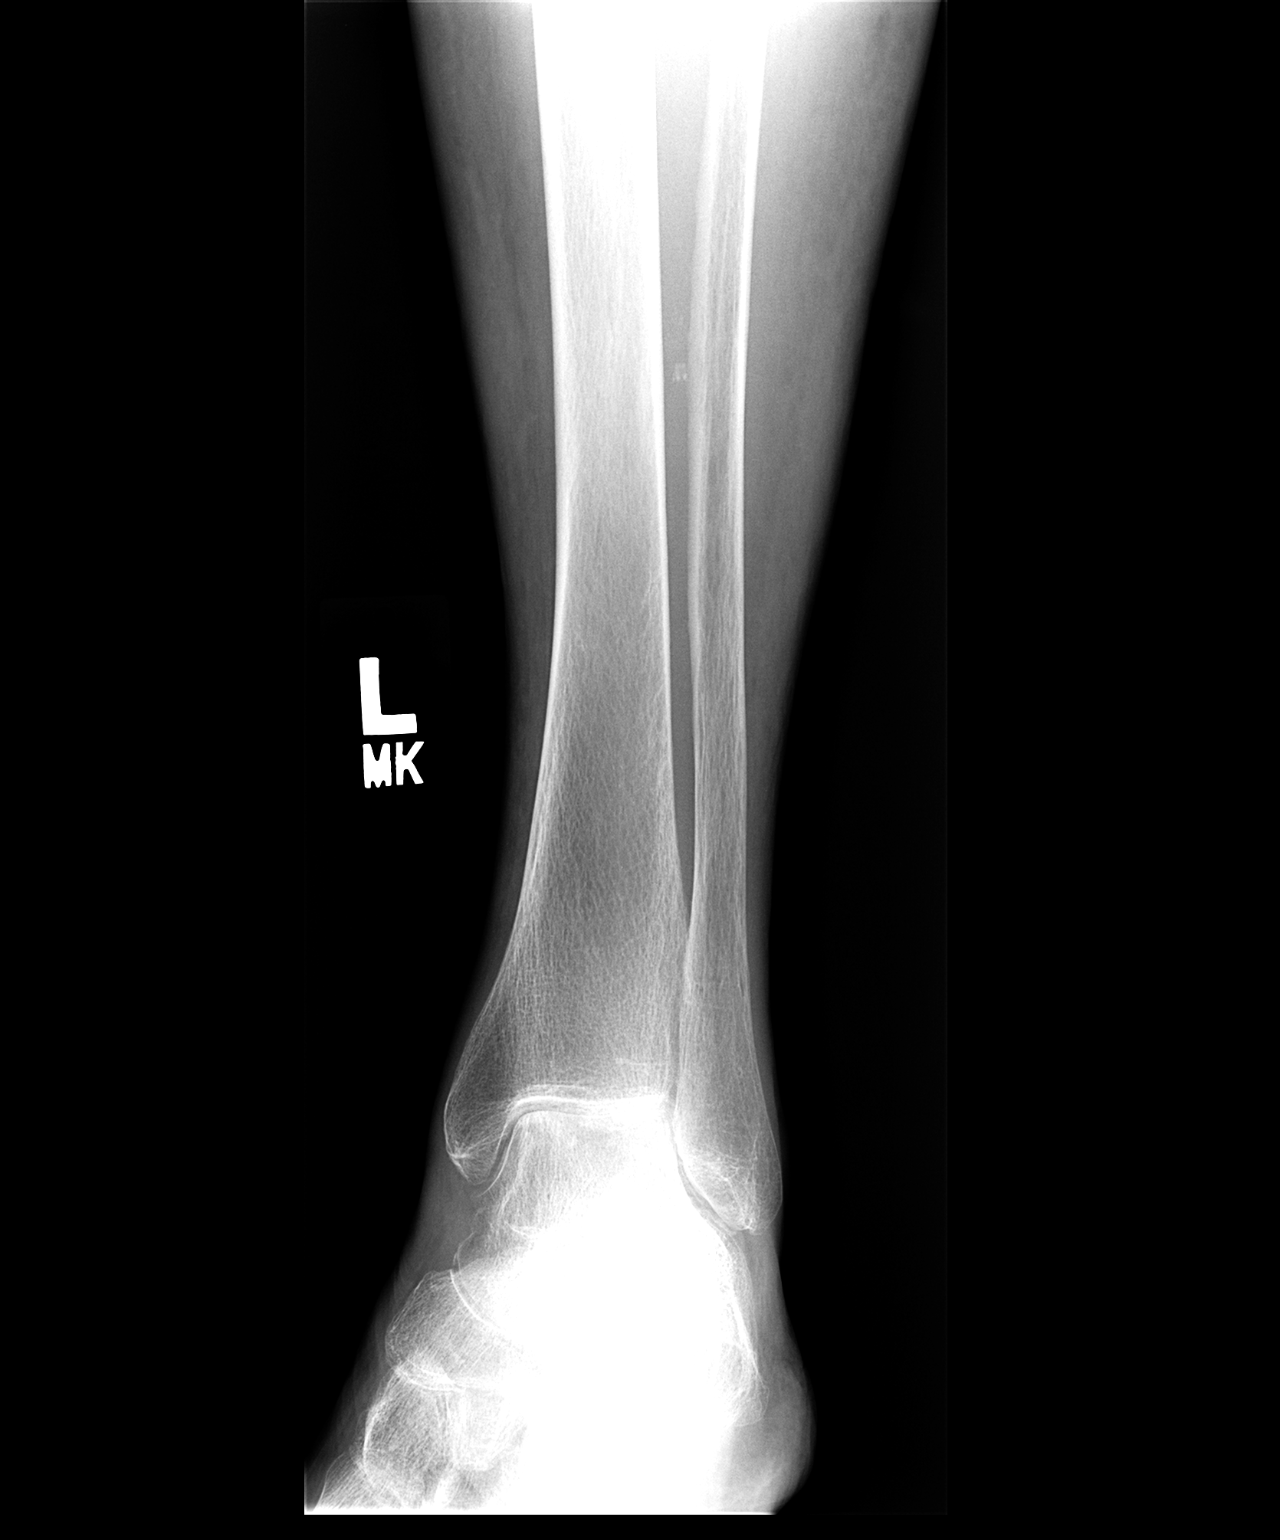

[view not recorded (3 of 3)]
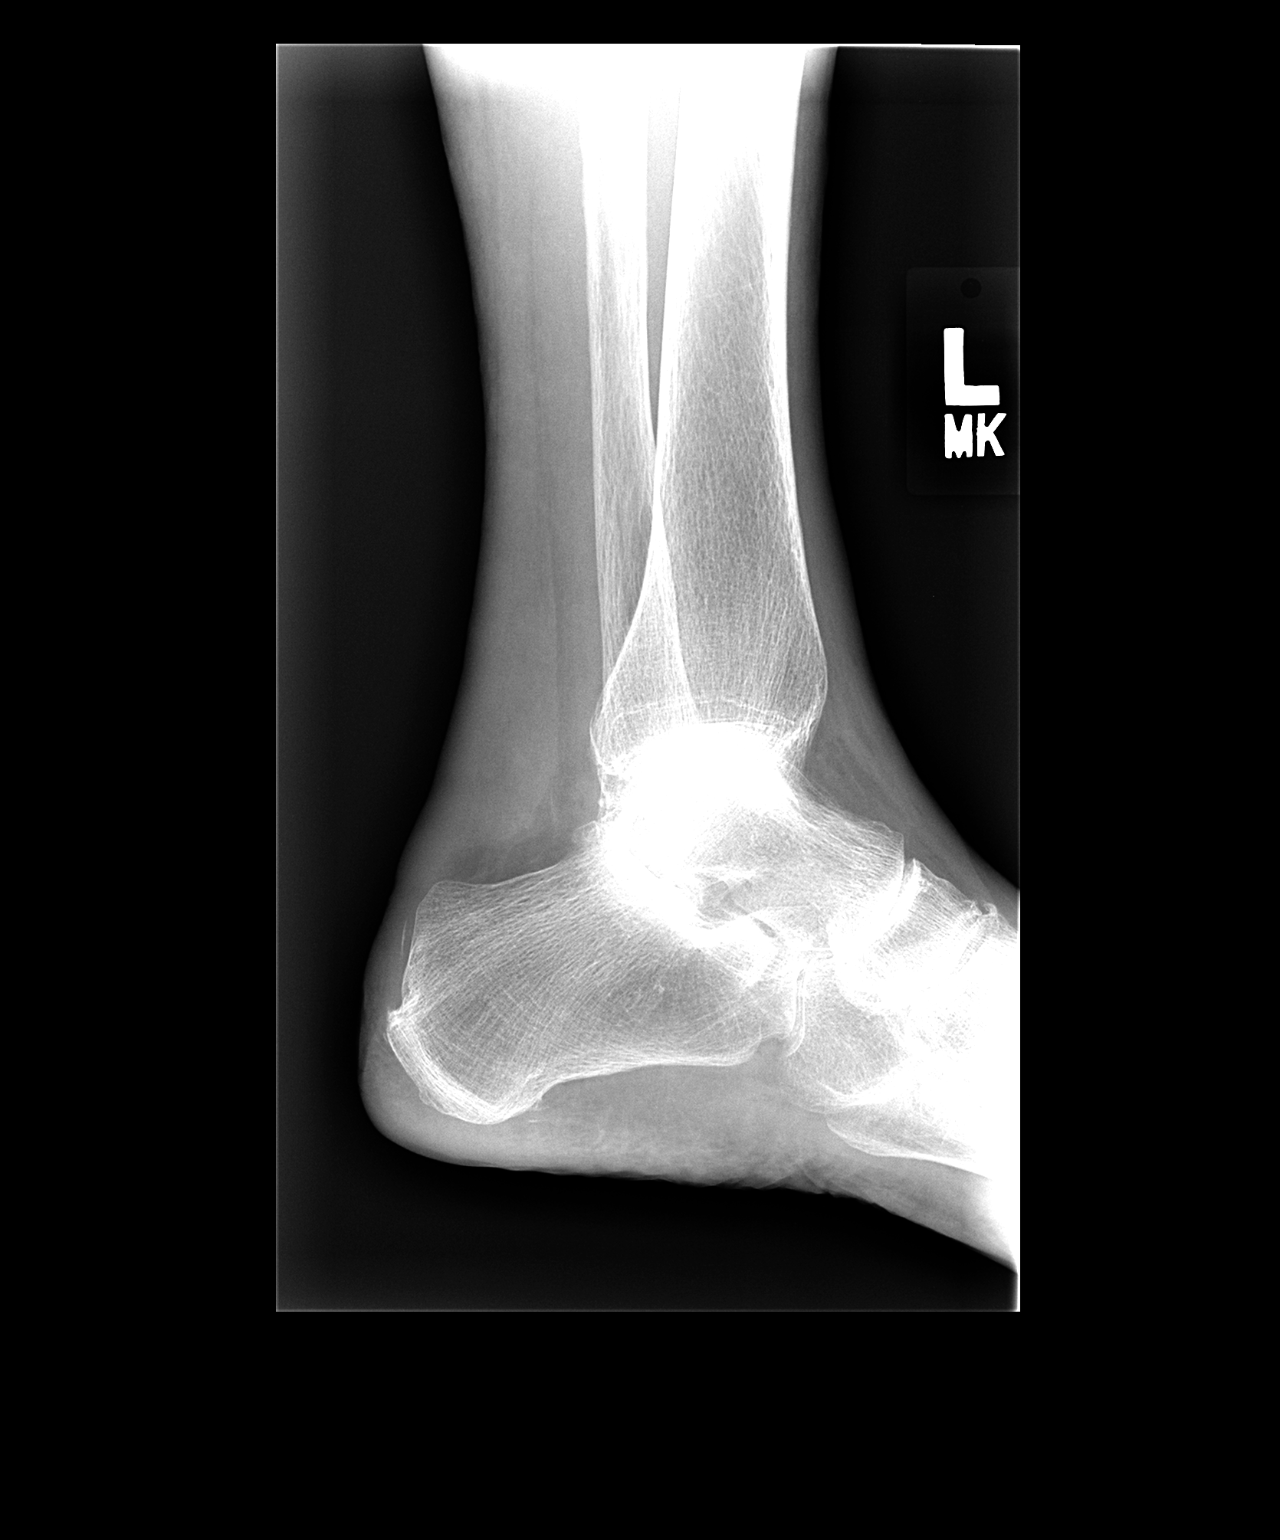

[3 of 3 positions shown; findings below may reference images not displayed]

FINDINGS: No evidence of acute fracture or dislocation.  Ankle
mortise intact with marked joint space narrowing.  No ankle joint
effusion.  Severe osteopenia.
IMPRESSION: No acute skeletal abnormalities.  Moderate to severe degenerative
changes.  Severe osteopenia.

## 2010-05-25 ENCOUNTER — Ambulatory Visit: Payer: Self-pay | Admitting: Family Medicine

## 2010-06-19 ENCOUNTER — Encounter: Payer: Self-pay | Admitting: Family Medicine

## 2010-07-26 ENCOUNTER — Ambulatory Visit
Admission: RE | Admit: 2010-07-26 | Discharge: 2010-07-26 | Payer: Self-pay | Source: Home / Self Care | Attending: Family Medicine | Admitting: Family Medicine

## 2010-07-26 ENCOUNTER — Other Ambulatory Visit: Payer: Self-pay | Admitting: Family Medicine

## 2010-07-27 LAB — RENAL FUNCTION PANEL
Albumin: 3.6 g/dL (ref 3.5–5.2)
BUN: 18 mg/dL (ref 6–23)
CO2: 32 mEq/L (ref 19–32)
Calcium: 10.9 mg/dL — ABNORMAL HIGH (ref 8.4–10.5)
Chloride: 99 mEq/L (ref 96–112)
Creatinine, Ser: 0.7 mg/dL (ref 0.4–1.2)
GFR: 82.61 mL/min (ref >60.00)
Glucose, Bld: 144 mg/dL — ABNORMAL HIGH (ref 70–99)
Phosphorus: 2.3 mg/dL (ref 2.3–4.6)
Potassium: 3.6 mEq/L (ref 3.5–5.1)
Sodium: 138 mEq/L (ref 135–145)

## 2010-07-27 LAB — HEMOGLOBIN A1C: Hgb A1c MFr Bld: 7.3 % — ABNORMAL HIGH (ref 4.6–6.5)

## 2010-08-22 NOTE — Assessment & Plan Note (Signed)
Summary: DISCUSS BLOOD SUGAR AND DIABESTES/DLO   Vital Signs:  Patient profile:   77 year old female Height:      65 inches Weight:      105.50 pounds Temp:     98.0 degrees F oral Pulse rate:   76 / minute Pulse rhythm:   regular BP sitting:   140 / 80  (left arm) Cuff size:   regular  Vitals Entered By: Linde Gillis CMA Duncan Dull) (October 14, 2009 3:29 PM) CC: discuss blood sugar and diabetes   History of Present Illness: she is here for blood sugar problems  3/13 -- sunday night - foot had been hurting all day -- especially the big toe  felt a little bump - thought it was cotton  pulled it off- possibly top of a callus -- asked her neighbor to look at it  said her toe looked inflammed and red   she went to family practice clinic and was dx with an infection in the foot  gave her some medicine for infection cephalexin 500 mg three times a day for 7 days   blood sugar went way up after that  by the next day it went down some  she got worried and called a nurse line from [facility]   sugar is back to normal now   wanted to know if she can eat light when blood sugar is high ?   goes to podiatry to have nails clipped occasionally   cannot tolerate her iron -- and she stopped it this fall  now she is more tired and weak and worries she may be more anemic again  wants to have this checked    Allergies: 1)  ! Sulfa  Past History:  Past Medical History: Last updated: 02/14/2009 Diabetes mellitus, type I -  has to check blood sugars QID due to hypoglycemia GERD Hyperlipidemia Hypertension osteoporosis spinal compression fx hyperparathyroidism wrist fx 09 venous insufficiency- supp host   ortho--Dr Rayburn Ma  Past Surgical History: Last updated: 09/29/2008 MRI - L SPINE 8/01 HIP FX 02 L2 COMP FX 1964 DEXA - OSTEOPOROSIS 1/04 COLONOSCOPY - DIVERTICULOSIS 1995 DEXA OP - IMPROVED  SPINE, WORSE HIP 10/07 CT HEAD - ATROPHY/SM. VESSEL CHANGE 2/08 DEGENERATIVE  DISK/OA/SPONDYLOSIS - SPINE 08 OPTHY 4/06 - NEG    12/06 - NEG CT of abd/pelvis with contrast 3/10-- large amt of stool in colon, some diverticulosis   Family History: Last updated: 06/13/2009 Father:  Mother:  Siblings:  Family History of Colon Cancer: Father daugher died with alzheimers in her 65s  Social History: Last updated: 11/30/2008 Marital Status: WIDOW Children: 3 boys 1girl Occupation: RETIRED non smoker  no alcohol  Illicit Drug Use - no Daily Caffeine Use: 1 cup a day  Risk Factors: Smoking Status: never (10/07/2006)  Review of Systems General:  Denies fatigue, fever, loss of appetite, and malaise. Eyes:  Denies blurring and eye irritation. CV:  Denies chest pain or discomfort, near fainting, and palpitations. Resp:  Denies cough and shortness of breath. GI:  Denies abdominal pain and change in bowel habits. GU:  Denies hematuria. MS:  Complains of joint pain and stiffness; denies joint redness, joint swelling, and cramps. Derm:  Denies itching, lesion(s), poor wound healing, and rash. Neuro:  Complains of tingling; denies numbness. Psych:  Denies anxiety and depression. Endo:  Denies excessive thirst and excessive urination. Heme:  Denies abnormal bruising and bleeding.  Physical Exam  General:  frail appearing elderly female Head:  normocephalic, atraumatic, and  no abnormalities observed.   Eyes:  vision grossly intact, pupils equal, pupils round, and pupils reactive to light.   Mouth:  pharynx pink and moist.   Neck:  supple with full rom and no masses or thyromegally, no JVD or carotid bruit  Lungs:  Normal respiratory effort, chest expands symmetrically. Lungs are clear to auscultation, no crackles or wheezes. Heart:  Normal rate and regular rhythm. S1 and S2 normal without gallop, murmur, click, rub or other extra sounds. Msk:  L foot  toes and foot appear healthy  great toenails s/p removal - small  no tenderness or drainage  .5 cm callus fell  off bottom of foot during exam- with clean site beneath  Pulses:  feet are well perfused some varicosities  Extremities:  varicosities - but no swelling today Neurologic:  sensation intact to light touch, gait normal, and DTRs symmetrical and normal.   Skin:  redness on L leg with venous insuff is improved - along with dryness Cervical Nodes:  No lymphadenopathy noted Psych:  normal affect, talkative and pleasant    Impression & Recommendations:  Problem # 1:  CELLULITIS, GREAT TOE, LEFT (ICD-681.10) Assessment New this is now resolved after a course of keflex no signs of skin interruption or ingrown nail at this time recommended triple abx oint as needed and update if symptoms return I assured her the brief inc in sugar was from the infection- this is now normalized   Problem # 2:  CORNS AND CALLUSES (ICD-700) Assessment: New callus removed (fell off) bottom of foot under 1st mtp --- site is clean and non inflammed rec keeping clean / abx oint and corn pad as needed  continue f/u with her podiatrist   Problem # 3:  DIABETES MELLITUS, TYPE I (ICD-250.01) Assessment: Deteriorated  briefly inc sugars due to infection but now normalized will continue to follow closely and use sliding scale insulin  update if re- occurance of hyperglycemia commended on good diet Her updated medication list for this problem includes:    Humalog Mix 75/25 75-25 % Susp (Insulin lisp prot & lisp (hum)) ..... Use as directed    Zestril 20 Mg Tabs (Lisinopril) .Marland Kitchen... Take one and 1/2 q am  Labs Reviewed: Creat: 0.8 (06/28/2009)     Last Eye Exam: normal (03/01/2009) Reviewed HgBA1c results: 7.1 (06/28/2009)  7.3 (04/08/2009)  Problem # 4:  ANEMIA, IRON DEFICIENCY, CHRONIC (ICD-280.9) Assessment: Deteriorated of unknown source- likely poor dietary intake  pt inol of iron and off for a while  check labs today and adv Orders: Venipuncture (40981) T-CBC w/Diff ([id number]) T-Ferritin  (21308-65784) T-Iron (69629-52841) Specimen Handling (32440)  Complete Medication List: 1)  Humalog Mix 75/25 75-25 % Susp (Insulin lisp prot & lisp (hum)) .... Use as directed 2)  Potassium Chloride Crys Cr 20 Meq Cr-tabs (Potassium chloride crys cr) .... Take one by mouth daily 3)  Lipitor 10 Mg Tabs (Atorvastatin calcium) .... Take one by mouth daily 4)  Zestril 20 Mg Tabs (Lisinopril) .... Take one and 1/2 q am 5)  Aciphex 20 Mg Tbec (Rabeprazole sodium) .... Take one by mouth daily as needed 6)  Furosemide 40 Mg Tabs (Furosemide) .... Take one half by mouth daily 7)  Calcium + D Tabs (Calcium-vitamin d tabs) .... Take 600 mg by mouth two times a day 8)  Vitamin E 400 Unit Caps (Vitamin e) .... Take one by mouth daily 9)  Accu-chek Instant Glucose Test Strp (Glucose blood) .... Check blood glucose 6 times  daily and as needed for insulin dependent diabetes 250.01 (pt has hx of hypoglycemia 10)  Accu-chek Soft Touch Lancets Misc (Lancets) .... Check blood glucose 6 times daily and as needed for insulin dependent diabetes 250.01 (pt has hx of hypoglycemia 11)  Novofine 30g X 8 Mm Misc (Insulin pen needle) .... Inject twice a day 12)  Support Stockings To Knee 15 Mm Hg  .... To wear daily while active for venous insufficiency and swelling in legs 13)  Triamcinolone Acetonide 0.025 % Crea (Triamcinolone acetonide) .... Apply to affected area once daily as needed itch/rash 14)  Iron  .... One daily 15)  Amlodipine Besylate 5 Mg Tabs (Amlodipine besylate) .... Take one by mouth daily  Patient Instructions: 1)  toe and foot look ok  2)  corn /callus came of bottom of foot -- keep it very clean and use antibiotic ointment 3)  let me know if your pain or swelling or redness return 4)  continue follow ups with your podiatrist  5)  also let me know if sugar goes back   Current Allergies (reviewed today): ! SULFA

## 2010-08-22 NOTE — Progress Notes (Signed)
Summary: changed from caduet  Phone Note From Pharmacy   Caller: [facility] [place]. [phone]* Summary of Call: Refills on caduet were sent in this morning but pharmacy says that it is not covered by insurance.  Pharmacist change to amlodipine 5 mg's and lipitor 10 mg's.  Changed in emr. Initial call taken by: Lowella Petties CMA,  September 15, 2009 2:22 PM  Follow-up for Phone Call        that is fine can have 1 mo supply with 11 ref of each  let pt know about the change please Follow-up by: Judith Part MD,  September 15, 2009 3:15 PM  Additional Follow-up for Phone Call Additional follow up Details #1::        Patient notified as instructed by telephone. Lewanda Rife LPN  September 16, 2009 8:42 AM     New/Updated Medications: LIPITOR 10 MG TABS (ATORVASTATIN CALCIUM) take one by mouth daily AMLODIPINE BESYLATE 5 MG TABS (AMLODIPINE BESYLATE) take one by mouth daily  Prior Medications: HUMALOG MIX 75/25 75-25 % SUSP (INSULIN LISP PROT & LISP (HUM)) use as directed POTASSIUM CHLORIDE CRYS CR 20 MEQ CR-TABS (POTASSIUM CHLORIDE CRYS CR) take one by mouth daily ZESTRIL 20 MG TABS (LISINOPRIL) take one and 1/2 q am ACIPHEX 20 MG TBEC (RABEPRAZOLE SODIUM) take one by mouth daily as needed FUROSEMIDE 40 MG TABS (FUROSEMIDE) take one half by mouth daily CALCIUM + D  TABS (CALCIUM-VITAMIN D TABS) Take 600 mg by mouth two times a day VITAMIN E 400 UNIT CAPS (VITAMIN E) take one by mouth daily ACCU-CHEK INSTANT GLUCOSE TEST  STRP (GLUCOSE BLOOD) check blood glucose 6 times daily and as needed for insulin dependent diabetes 250.01 (pt has hx of hypoglycemia ACCU-CHEK SOFT TOUCH LANCETS  MISC (LANCETS) check blood glucose 6 times daily and as needed for insulin dependent diabetes 250.01 (pt has hx of hypoglycemia NOVOFINE 30G X 8 MM MISC (INSULIN PEN NEEDLE) inject twice a day SUPPORT STOCKINGS TO KNEE 15 MM HG () to wear daily while active for venous insufficiency and swelling in legs TRIAMCINOLONE  ACETONIDE 0.025 % CREA (TRIAMCINOLONE ACETONIDE) apply to affected area once daily as needed itch/rash IRON () one daily AMLODIPINE BESYLATE 5 MG TABS (AMLODIPINE BESYLATE) take one by mouth daily Current Allergies: ! SULFA

## 2010-08-22 NOTE — Miscellaneous (Signed)
Summary: Med list updated  Medications Added BD PEN NEEDLE NANO U/F 32G X 4 MM MISC (INSULIN PEN NEEDLE) Inject twice daily or as directed.       Clinical Lists Changes  Medications: Changed medication from BD PEN NEEDLE NANO U/F 32G X 4 MM MISC (INSULIN PEN NEEDLE) to BD PEN NEEDLE NANO U/F 32G X 4 MM MISC (INSULIN PEN NEEDLE) Inject twice daily or as directed. Removed medication of NOVOFINE 30G X 8 MM MISC (INSULIN PEN NEEDLE) inject twice a day     Current Allergies: ! SULFA

## 2010-08-22 NOTE — Letter (Signed)
Summary: Diabetic Supplies/[facility]  Diabetic Supplies/[facility]   Imported By: Sherian Rein 04/05/2010 07:50:26  _____________________________________________________________________  External Attachment:    Type:   Image     Comment:   External Document

## 2010-08-22 NOTE — Letter (Signed)
Summary: CMN for Diabetes Supplies/[facility]  CMN for Diabetes Supplies/[facility]   Imported By: Lanelle Bal 06/23/2010 13:07:34  _____________________________________________________________________  External Attachment:    Type:   Image     Comment:   External Document

## 2010-08-22 NOTE — Assessment & Plan Note (Signed)
Summary: FLU SHOT/DLO   Nurse Visit   Allergies: 1)  ! Sulfa  Orders Added: 1)  Flu Vaccine 82yrs + MEDICARE PATIENTS [Q2039] 2)  Administration Flu vaccine - MCR [G0008]         Flu Vaccine Consent Questions     Do you have a history of severe allergic reactions to this vaccine? no    Any prior history of allergic reactions to egg and/or gelatin? no    Do you have a sensitivity to the preservative Thimersol? no    Do you have a past history of Guillan-Barre Syndrome? no    Do you currently have an acute febrile illness? no    Have you ever had a severe reaction to latex? no    Vaccine information given and explained to patient? yes    Are you currently pregnant? no    Lot Number:[id number]   Exp Date:01/20/2011   Site Given  Left Deltoid IM Lewanda Rife LPN  May 25, 2010 2:58 PM

## 2010-08-22 NOTE — Assessment & Plan Note (Signed)
Summary: 6 MONTH FOLLOW UP/[facility]H   Vital Signs:  Patient profile:   77 year old female Height:      65 inches Weight:      105.25 pounds BMI:     17.58 Temp:     98.9 degrees F oral Pulse rate:   84 / minute Pulse rhythm:   regular BP sitting:   148 / 78  (left arm) Cuff size:   regular  Vitals Entered By: Lewanda Rife LPN (December 28, 1608 3:21 PM)  Serial Vital Signs/Assessments:  Time      Position  BP       Pulse  Resp  Temp     By                     140/80                         Judith Part MD  CC: six month f/u   History of Present Illness: here for f/u of chronic med problems incl HTN and DM and chol   is overall feeling well  nothing new feels her age overall   is due for labs   on statin and diet for chol- very good control  Last Lipid ProfileCholesterol: 128 (04/08/2009 9:41:38 AM)HDL:  51.30 (04/08/2009 9:41:38 AM)LDL:  67 (04/08/2009 9:41:38 AM)Triglycerides:  Last Liver profileSGOT:  22 (04/08/2009 9:41:38 AM)SPGT:  17 (04/08/2009 9:41:38 AM)T. 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 Sulfa  Past History:  Past Medical History: Last updated: 02/14/2009 Diabetes mellitus, type I -  has to check blood sugars QID due to hypoglycemia GERD Hyperlipidemia Hypertension osteoporosis spinal compression fx hyperparathyroidism wrist fx 09 venous insufficiency- supp host   ortho--Dr Rayburn Ma  Past Surgical History: Last updated: 09/29/2008 MRI - L SPINE  8/01 HIP FX 02 L2 COMP FX 1964 DEXA - OSTEOPOROSIS 1/04 COLONOSCOPY - DIVERTICULOSIS 1995 DEXA OP - IMPROVED  SPINE, WORSE HIP 10/07 CT HEAD - ATROPHY/SM. VESSEL CHANGE 2/08 DEGENERATIVE DISK/OA/SPONDYLOSIS - SPINE 08 OPTHY 4/06 - NEG    12/06 - NEG CT of abd/pelvis with contrast 3/10-- large amt of stool in colon, some diverticulosis   Family History: Last updated: 06/13/2009 Father:  Mother:  Siblings:  Family History of Colon Cancer: Father daugher died with alzheimers in her 25s  Social History: Last updated: 11/30/2008 Marital Status: WIDOW Children: 3 boys 1girl Occupation: RETIRED non smoker  no alcohol  Illicit Drug Use - no Daily Caffeine Use: 1 cup a day  Risk Factors: Smoking Status: never (10/07/2006)  Review of Systems General:  Denies fatigue, loss of appetite, and malaise. Eyes:  Denies blurring and eye irritation. CV:  Denies chest pain or discomfort, palpitations, shortness of breath with exertion, and swelling of feet. Resp:  Denies cough, shortness of breath, and wheezing.  GI:  Denies abdominal pain, change in bowel habits, indigestion, and nausea. GU:  Denies dysuria, hematuria, and urinary frequency. MS:  Complains of joint pain and stiffness; denies joint redness and joint swelling. Derm:  Denies itching, lesion(s), poor wound healing, and rash. Neuro:  Complains of falling down; denies numbness and tingling; falls occasionally-- but absolutely forbids her family to know , or any help in the home whatsoever  . Psych:  Denies anxiety and depression. Endo:  Denies excessive thirst and excessive urination.  Physical Exam  General:  frail appearing elderly female Head:  normocephalic, atraumatic, and no abnormalities observed.   Eyes:  vision grossly intact, pupils equal, pupils round, and pupils reactive to light.  no conjunctival pallor, injection or icterus  Mouth:  pharynx pink and moist.   Neck:  supple with full rom and no masses or  thyromegally, no JVD or carotid bruit  Lungs:  Normal respiratory effort, chest expands symmetrically. Lungs are clear to auscultation, no crackles or wheezes. Heart:  Normal rate and regular rhythm. S1 and S2 normal without gallop, murmur, click, rub or other extra sounds. Abdomen:  no suprapubic tenderness or fullness felt  Msk:  No deformity or scoliosis noted of thoracic or lumbar spine.  kyphosis is baseline  Pulses:  feet are well perfused some varicosities  Extremities:  varicosities - but no swelling today Neurologic:  sensation intact to light touch, gait normal, and DTRs symmetrical and normal.    Diabetes Management Exam:    Foot Exam (with socks and/or shoes not present):       Sensory-Pinprick/Light touch:          Left medial foot (L-4): normal          Left dorsal foot (L-5): normal          Left lateral foot (S-1): normal          Right medial foot (L-4): normal          Right dorsal foot (L-5): normal          Right lateral foot (S-1): normal       Sensory-Monofilament:          Left foot: normal          Right foot: normal       Sensory-other: feet are hypersensitive        Inspection:          Left foot: normal          Right foot: normal       Nails:          Left foot: normal          Right foot: normal   Impression & Recommendations:  Problem # 1:  ANEMIA, IRON DEFICIENCY, CHRONIC (ICD-280.9) Assessment Unchanged  now on childrens mvi with iron because she does not tolerate adult dose  check cbc with diff and update  Orders: Venipuncture (13086) TLB-Lipid Panel (80061-LIPID) TLB-Renal Function Panel (80069-RENAL) TLB-CBC Platelet - w/Differential (85025-CBCD) TLB-ALT (SGPT) (84460-ALT) TLB-AST (SGOT) (84450-SGOT) TLB-A1C / Hgb A1C (Glycohemoglobin) (83036-A1C)  Hgb: 11.7 (10/14/2009)   Hct: 36.5 (10/14/2009)   Platelets: 180 (10/14/2009) RBC: 4.00 (10/14/2009)   RDW: 14.3 (10/14/2009)   WBC: 8.0 (10/14/2009) MCV: 91.3 (10/14/2009)   MCHC: 32.1  (10/14/2009) Ferritin: 75 (10/14/2009) Iron: 34 (10/14/2009)   % Sat: 14.1 (11/30/2008) B12: >1500 pg/mL (11/30/2008)   Folate: >20.0 ng/mL (11/30/2008)     Problem # 2:  HYPERPARATHYROIDISM, HX OF (ICD-V12.2) Assessment: Unchanged  check ca level today with labs no clinical changes   Problem # 3:  HYPERTENSION (ICD-401.9) Assessment: Unchanged   bp is a bit high- but pt does not tolerate more med than she is on so will not change anything  tends to run better at home when relaxed as well f/u 6 mo  labs today Her updated medication list for this problem includes:    Zestril 20 Mg Tabs (Lisinopril) .Marland Kitchen... Take one and 1/2 q am    Furosemide 40 Mg Tabs (Furosemide) .Marland Kitchen... Take one half by mouth daily    Amlodipine Besylate 5 Mg Tabs (Amlodipine besylate) .Marland Kitchen... Take one by mouth daily  Orders: Venipuncture (16109) TLB-Lipid Panel (80061-LIPID) TLB-Renal Function Panel (80069-RENAL) TLB-CBC Platelet - w/Differential (85025-CBCD) TLB-ALT (SGPT) (84460-ALT) TLB-AST (SGOT) (84450-SGOT) TLB-A1C / Hgb A1C (Glycohemoglobin) (83036-A1C) TLB-TSH (Thyroid Stimulating Hormone) (84443-TSH)  BP today: 148/78-- re check 140/80 Prior BP: 140/80 (10/14/2009)  Labs Reviewed: K+: 3.4 (07/01/2009) Creat: : 0.8 (06/28/2009)   Chol: 128 (04/08/2009)   HDL: 51.30 (04/08/2009)   LDL: 67 (04/08/2009)   TG: 47.0 (04/08/2009)  Problem # 4:  DIABETES MELLITUS, TYPE I (ICD-250.01) Assessment: Unchanged per pt - quite stable with very strict diet and insulin labs today up to date opthy good foot care f/u in 6 mo  Her updated medication list for this problem includes:    Humalog Mix 75/25 75-25 % Susp (Insulin lisp prot & lisp (hum)) ..... Use as directed    Zestril 20 Mg Tabs (Lisinopril) .Marland Kitchen... Take one and 1/2 q am  Orders: Venipuncture (60454) TLB-Lipid Panel (80061-LIPID) TLB-Renal Function Panel (80069-RENAL) TLB-CBC Platelet - w/Differential (85025-CBCD) TLB-ALT (SGPT) (84460-ALT) TLB-AST  (SGOT) (84450-SGOT) TLB-A1C / Hgb A1C (Glycohemoglobin) (83036-A1C)  Complete Medication List: 1)  Humalog Mix 75/25 75-25 % Susp (Insulin lisp prot & lisp (hum)) .... Use as directed 2)  Potassium Chloride Crys Cr 20 Meq Cr-tabs (Potassium chloride crys cr) .... Take one by mouth daily 3)  Lipitor 10 Mg Tabs (Atorvastatin calcium) .... Take one by mouth daily 4)  Zestril 20 Mg Tabs (Lisinopril) .... Take one and 1/2 q am 5)  Aciphex 20 Mg Tbec (Rabeprazole sodium) .... Take one by mouth daily as needed 6)  Furosemide 40 Mg Tabs (Furosemide) .... Take one half by mouth daily 7)  Calcium + D Tabs (Calcium-vitamin d tabs) .... Take 600 mg by mouth two times a day 8)  Vitamin E 400 Unit Caps (Vitamin e) .... Take one by mouth daily 9)  Accu-chek Instant Glucose Test Strp (Glucose blood) .... Check blood glucose 6 times daily and as needed for insulin dependent diabetes 250.01 (pt has hx of hypoglycemia 10)  Accu-chek Soft Touch Lancets Misc (Lancets) .... Check blood glucose 6 times daily and as needed for insulin dependent diabetes 250.01 (pt has hx of hypoglycemia 11)  Novofine 30g X 8 Mm Misc (Insulin pen needle) .... Inject twice a day 12)  Support Stockings To Knee 15 Mm Hg  .... To wear daily while active for venous insufficiency and swelling in legs 13)  Triamcinolone Acetonide 0.025 % Crea (Triamcinolone acetonide) .... Apply to affected area once daily as needed itch/rash 14)  Amlodipine Besylate 5 Mg Tabs (Amlodipine besylate) .... Take one by mouth daily 15)  Flintstones Plus Iron Chew (Pediatric multivitamins-iron) .... Chew one daily  Other Orders: T-Vitamin D (25-Hydroxy) [id number])  Patient Instructions: 1)  no change in medicines  2)  labs today  3)  blood pressure is stable  4)  follow up with me in 6 months   Current Allergies (reviewed today): ! SULFA

## 2010-08-22 NOTE — Assessment & Plan Note (Signed)
Summary: RIGHT EAR x 1 MTH/CLE   Vital Signs:  Patient profile:   77 year old female Height:      65 inches Weight:      105.75 pounds BMI:     17.66 Temp:     99.5 degrees F oral Pulse rate:   84 / minute Pulse rhythm:   regular BP sitting:   150 / 84  (left arm) Cuff size:   regular  Vitals Entered By: Lewanda Rife LPN (March 31, 2010 4:03 PM) CC: Rt ear has roaring sound   History of Present Illness: is interested in having her ears washed out  started having trouble when she was in very cold air conditioning  then her R ear was making a loud roaring noise  that way for 3-4 weeks   using sweet oil a bit of wax on q tip   can not hear out of that ear   Allergies: 1)  ! Sulfa  Past History:  Past Medical History: Last updated: 02/14/2009 Diabetes mellitus, type I -  has to check blood sugars QID due to hypoglycemia GERD Hyperlipidemia Hypertension osteoporosis spinal compression fx hyperparathyroidism wrist fx 09 venous insufficiency- supp host   ortho--Dr Rayburn Ma  Past Surgical History: Last updated: 09/29/2008 MRI - L SPINE 8/01 HIP FX 02 L2 COMP FX 1964 DEXA - OSTEOPOROSIS 1/04 COLONOSCOPY - DIVERTICULOSIS 1995 DEXA OP - IMPROVED  SPINE, WORSE HIP 10/07 CT HEAD - ATROPHY/SM. VESSEL CHANGE 2/08 DEGENERATIVE DISK/OA/SPONDYLOSIS - SPINE 08 OPTHY 4/06 - NEG    12/06 - NEG CT of abd/pelvis with contrast 3/10-- large amt of stool in colon, some diverticulosis   Family History: Last updated: 06/13/2009 Father:  Mother:  Siblings:  Family History of Colon Cancer: Father daugher died with alzheimers in her 80s  Social History: Last updated: 11/30/2008 Marital Status: WIDOW Children: 3 boys 1girl Occupation: RETIRED non smoker  no alcohol  Illicit Drug Use - no Daily Caffeine Use: 1 cup a day  Risk Factors: Smoking Status: never (10/07/2006)  Review of Systems General:  Denies chills, fatigue, fever, loss of appetite, and  malaise. Eyes:  Denies blurring. ENT:  Complains of decreased hearing; denies ear discharge and earache. CV:  Denies chest pain or discomfort. Resp:  Denies shortness of breath. GI:  Denies nausea and vomiting. Derm:  Denies itching and rash. Neuro:  Denies falling down and sensation of room spinning. Heme:  Denies abnormal bruising and bleeding.  Physical Exam  General:  frail appearing elderly female Head:  normocephalic, atraumatic, and no abnormalities observed.   Eyes:  vision grossly intact, pupils equal, pupils round, and pupils reactive to light.  no conjunctival pallor, injection or icterus  Ears:  L TM - slt effusion / clear R canal - cerumen impaction Mouth:  pharynx pink and moist.   Neck:  No deformities, masses, or tenderness noted.   Impression & Recommendations:  Problem # 1:  CERUMEN IMPACTION, RIGHT (ICD-380.4) Assessment New improved after simple irrigation of that ear - with nl app TM afterwards adv to call if ear pain or sensitivity  Complete Medication List: 1)  Humalog Mix 75/25 75-25 % Susp (Insulin lisp prot & lisp (hum)) .... Use as directed 2)  Potassium Chloride Crys Cr 20 Meq Cr-tabs (Potassium chloride crys cr) .... Take one by mouth daily 3)  Lipitor 10 Mg Tabs (Atorvastatin calcium) .... Take one by mouth daily 4)  Zestril 20 Mg Tabs (Lisinopril) .... Take one and 1/2 q am  5)  Aciphex 20 Mg Tbec (Rabeprazole sodium) .... Take one by mouth daily as needed 6)  Furosemide 40 Mg Tabs (Furosemide) .... Take one by mouth daily 7)  Calcium + D Tabs (Calcium-vitamin d tabs) .... Take 600 mg by mouth two times a day 8)  Vitamin E 400 Unit Caps (Vitamin e) .... Take one by mouth daily 9)  Accu-chek Instant Glucose Test Strp (Glucose blood) .... Check blood glucose 6 times daily and as needed for insulin dependent diabetes 250.01 (pt has hx of hypoglycemia 10)  Accu-chek Soft Touch Lancets Misc (Lancets) .... Check blood glucose 6 times daily and as needed  for insulin dependent diabetes 250.01 (pt has hx of hypoglycemia 11)  Support Stockings To Knee 15 Mm Hg  .... To wear daily while active for venous insufficiency and swelling in legs 12)  Triamcinolone Acetonide 0.025 % Crea (Triamcinolone acetonide) .... Apply to affected area once daily as needed itch/rash 13)  Amlodipine Besylate 5 Mg Tabs (Amlodipine besylate) .... Take one by mouth daily 14)  Flintstones Plus Iron Chew (Pediatric multivitamins-iron) .... Chew one daily 15)  Bd Pen Needle Nano U/f 32g X 4 Mm Misc (Insulin pen needle) .... Inject twice daily or as directed.  Patient Instructions: 1)  ear wax is much improved after ear irrigation  2)  you can use sweet oil in ears once a week 3)  use caution with q tips   Current Allergies (reviewed today): ! SULFA

## 2010-08-24 NOTE — Assessment & Plan Note (Signed)
Summary: TONSIL IS PINKAGE RED, BUT NO FEVER / LFW   Vital Signs:  Patient profile:   77 year old female Height:      65 inches Weight:      103.75 pounds BMI:     17.33 Temp:     98.8 degrees F oral Pulse rate:   96 / minute Pulse rhythm:   regular BP sitting:   158 / 80  (left arm) Cuff size:   regular  Vitals Entered By: Lewanda Rife LPN (July 26, 2010 4:04 PM) CC: Tonsils are pinkish red and tongue has a yellow coating. No fever and no sorethroat   History of Present Illness: her dentures were wrong -- "ate the side of her toungue up " waiting for it to heal until she gets her 2nd set  did have a biopsy - and does not have result yet from her dentist   has been gargling with salt water  whole toungue looked like yellow carpet and also some redness/ raw looking  tonsils looked more pink than normal as well  she has tried to scrape off with paper towel  has not been on abx  no fever throat is not sore   mouth is not sore  spot on toungue is almost healed   does not think they used iodine    wt is down 2 lb     Allergies: 1)  ! Sulfa  Past History:  Past Medical History: Last updated: 02/14/2009 Diabetes mellitus, type I -  has to check blood sugars QID due to hypoglycemia GERD Hyperlipidemia Hypertension osteoporosis spinal compression fx hyperparathyroidism wrist fx 09 venous insufficiency- supp host   ortho--Dr Rayburn Ma  Past Surgical History: Last updated: 09/29/2008 MRI - L SPINE 8/01 HIP FX 02 L2 COMP FX 1964 DEXA - OSTEOPOROSIS 1/04 COLONOSCOPY - DIVERTICULOSIS 1995 DEXA OP - IMPROVED  SPINE, WORSE HIP 10/07 CT HEAD - ATROPHY/SM. VESSEL CHANGE 2/08 DEGENERATIVE DISK/OA/SPONDYLOSIS - SPINE 08 OPTHY 4/06 - NEG    12/06 - NEG CT of abd/pelvis with contrast 3/10-- large amt of stool in colon, some diverticulosis   Family History: Last updated: 06/13/2009 Father:  Mother:  Siblings:  Family History of Colon Cancer: Father daugher  died with alzheimers in her 53s  Social History: Last updated: 11/30/2008 Marital Status: WIDOW Children: 3 boys 1girl Occupation: RETIRED non smoker  no alcohol  Illicit Drug Use - no Daily Caffeine Use: 1 cup a day  Risk Factors: Smoking Status: never (10/07/2006)  Review of Systems General:  Denies chills, fatigue, fever, loss of appetite, and malaise. Eyes:  Denies blurring and eye irritation. ENT:  Denies nasal congestion, nosebleeds, postnasal drainage, and sore throat. CV:  Denies chest pain or discomfort and palpitations. Resp:  Denies cough and wheezing. GI:  Denies abdominal pain. Derm:  Denies itching, lesion(s), poor wound healing, and rash. Endo:  Denies cold intolerance, excessive thirst, excessive urination, and heat intolerance. Heme:  Denies bleeding.  Physical Exam  General:  frail appearing elderly female Head:  normocephalic, atraumatic, and no abnormalities observed.   Eyes:  vision grossly intact, pupils equal, pupils round, and pupils reactive to light.   Mouth:  toungue has mild yellow non scrapable coating in middle -non tender small healing wound on R side of toungue  throat - very slt injection Neck:  No deformities, masses, or tenderness noted. Lungs:  Normal respiratory effort, chest expands symmetrically. Lungs are clear to auscultation, no crackles or wheezes. Heart:  Normal rate  and regular rhythm. S1 and S2 normal without gallop, murmur, click, rub or other extra sounds. Skin:  Intact without suspicious lesions or rashes Cervical Nodes:  No lymphadenopathy noted Psych:  normal affect, talkative and pleasant    Impression & Recommendations:  Problem # 1:  THRUSH (ICD-112.0) Assessment New  thrush after some dental work and toungue bx in diabetic  toungue bx is substantially healed wll tx with nystatin solution - swish and swallow three times a day  update if not imp in 10-14 days  pend bx report and hopefully better fitting dentures     Orders: Prescription Created Electronically [id number])  Problem # 2:  DIABETES MELLITUS, TYPE I (ICD-250.01) Assessment: Unchanged has had some change in diet - pureed food  check AIC  per pt sugars are stable  Her updated medication list for this problem includes:    Humalog Mix 75/25 75-25 % Susp (Insulin lisp prot & lisp (hum)) ..... Use as directed    Zestril 20 Mg Tabs (Lisinopril) .Marland Kitchen... Take one and 1/2 q am  Orders: Venipuncture (60454) TLB-Renal Function Panel (80069-RENAL) TLB-A1C / Hgb A1C (Glycohemoglobin) (83036-A1C)  Problem # 3:  HYPERTENSION (ICD-401.9) Assessment: Deteriorated blood pressure is high on first check today with improvement  per pt - is fairly controlled at home some anxiety/ white coat syndrome will continue to monitor  Her updated medication list for this problem includes:    Zestril 20 Mg Tabs (Lisinopril) .Marland Kitchen... Take one and 1/2 q am    Furosemide 40 Mg Tabs (Furosemide) .Marland Kitchen... Take one by mouth daily    Amlodipine Besylate 5 Mg Tabs (Amlodipine besylate) .Marland Kitchen... Take one by mouth daily  Orders: Venipuncture (09811) TLB-Renal Function Panel (80069-RENAL) TLB-A1C / Hgb A1C (Glycohemoglobin) (83036-A1C)  BP today: 158/80-- re check 140/80 Prior BP: 150/84 (03/31/2010)  Labs Reviewed: K+: 3.8 (12/28/2009) Creat: : 0.8 (12/28/2009)   Chol: 135 (12/28/2009)   HDL: 57.60 (12/28/2009)   LDL: 69 (12/28/2009)   TG: 43.0 (12/28/2009)  Complete Medication List: 1)  Humalog Mix 75/25 75-25 % Susp (Insulin lisp prot & lisp (hum)) .... Use as directed 2)  Potassium Chloride Crys Cr 20 Meq Cr-tabs (Potassium chloride crys cr) .... Take one by mouth daily 3)  Lipitor 10 Mg Tabs (Atorvastatin calcium) .... Take one by mouth daily 4)  Zestril 20 Mg Tabs (Lisinopril) .... Take one and 1/2 q am 5)  Aciphex 20 Mg Tbec (Rabeprazole sodium) .... Take one by mouth daily as needed 6)  Furosemide 40 Mg Tabs (Furosemide) .... Take one by mouth daily 7)  Calcium + D  Tabs (Calcium-vitamin d tabs) .... Take 600 mg by mouth two times a day 8)  Vitamin E 400 Unit Caps (Vitamin e) .... Take one by mouth daily 9)  Accu-chek Instant Glucose Test Strp (Glucose blood) .... Check blood glucose 6 times daily and as needed for insulin dependent diabetes 250.01 (pt has hx of hypoglycemia 10)  Accu-chek Soft Touch Lancets Misc (Lancets) .... Check blood glucose 6 times daily and as needed for insulin dependent diabetes 250.01 (pt has hx of hypoglycemia 11)  Amlodipine Besylate 5 Mg Tabs (Amlodipine besylate) .... Take one by mouth daily 12)  Flintstones Plus Iron Chew (Pediatric multivitamins-iron) .... Chew one daily 13)  Bd Pen Needle Nano U/f 32g X 4 Mm Misc (Insulin pen needle) .... Inject twice daily or as directed. 14)  Nystatin 100000 Unit/ml Susp (Nystatin) .Marland Kitchen.. 1 teaspoon swish and swallow three times a day for 7-10 days (  until better)  Patient Instructions: 1)  use the nystatin mouthwash for thrush  2)  update me if not improved in 10-14 days  3)  I sent your px to [facility] pharmacy  Prescriptions: NYSTATIN 100000 UNIT/ML SUSP (NYSTATIN) 1 teaspoon swish and swallow three times a day for 7-10 days (until better)  #150cc x 0   Entered and Authorized by:   Judith Part MD   Signed by:   Judith Part MD on 07/26/2010   Method used:   Electronically to        [facility]* (retail)       [id number]       [place], [place]  [address]       Ph: [phone]       Fax: [phone]   RxID:   [id number]    Orders Added: 1)  Venipuncture [72536] 2)  TLB-Renal Function Panel [80069-RENAL] 3)  TLB-A1C / Hgb A1C (Glycohemoglobin) [83036-A1C] 4)  Prescription Created Electronically [[id number]] 5)  Est. Patient Level IV [64403]    Current Allergies (reviewed today): ! SULFA

## 2010-09-04 ENCOUNTER — Encounter: Payer: Self-pay | Admitting: Family Medicine

## 2010-09-13 ENCOUNTER — Encounter: Payer: Self-pay | Admitting: Family Medicine

## 2010-09-13 LAB — FECAL OCCULT BLOOD, GUAIAC: Fecal Occult Blood: NEGATIVE

## 2010-09-13 LAB — HM DIABETES EYE EXAM

## 2010-09-13 LAB — HM COLONOSCOPY

## 2010-09-13 LAB — HM DIABETES FOOT EXAM

## 2010-10-11 ENCOUNTER — Other Ambulatory Visit: Payer: Self-pay | Admitting: Family Medicine

## 2010-10-13 NOTE — Telephone Encounter (Signed)
Pt already scheduled appt with Dr Milinda Antis 11/08/10.

## 2010-10-19 LAB — HM DIABETES EYE EXAM: HM Diabetic Eye Exam: NEGATIVE

## 2010-11-01 ENCOUNTER — Ambulatory Visit: Payer: Self-pay | Admitting: Family Medicine

## 2010-11-03 ENCOUNTER — Telehealth: Payer: Self-pay

## 2010-11-03 NOTE — Telephone Encounter (Signed)
Needed to do quick abstraction to enter into health maintenance negative DM eye exam done on 10/19/10 at [facility]. Sent for scanning.

## 2010-11-08 ENCOUNTER — Ambulatory Visit: Payer: Self-pay | Admitting: Family Medicine

## 2010-11-08 ENCOUNTER — Encounter: Payer: Self-pay | Admitting: Family Medicine

## 2010-11-08 ENCOUNTER — Ambulatory Visit (INDEPENDENT_AMBULATORY_CARE_PROVIDER_SITE_OTHER): Payer: Medicare Other | Admitting: Family Medicine

## 2010-11-08 DIAGNOSIS — E785 Hyperlipidemia, unspecified: Secondary | ICD-10-CM

## 2010-11-08 DIAGNOSIS — D649 Anemia, unspecified: Secondary | ICD-10-CM

## 2010-11-08 DIAGNOSIS — I10 Essential (primary) hypertension: Secondary | ICD-10-CM

## 2010-11-08 DIAGNOSIS — E109 Type 1 diabetes mellitus without complications: Secondary | ICD-10-CM

## 2010-11-08 DIAGNOSIS — Z8639 Personal history of other endocrine, nutritional and metabolic disease: Secondary | ICD-10-CM

## 2010-11-08 DIAGNOSIS — Z862 Personal history of diseases of the blood and blood-forming organs and certain disorders involving the immune mechanism: Secondary | ICD-10-CM

## 2010-11-08 NOTE — Assessment & Plan Note (Signed)
Well controlled on lipitor and diet Due for 6 mo labs today Rev low sat fat diet  No side eff

## 2010-11-08 NOTE — Assessment & Plan Note (Signed)
bp is fairly controlled- not at goal for DM - but best we can do without orthostasis Continue to monitor anx makes it higher in office too

## 2010-11-08 NOTE — Assessment & Plan Note (Signed)
Overall stable Allowing a1c to be higher than goal due to frequent hypoglycemia if any lower Rev meals and sugar readings  utd opthy a1c and renal today

## 2010-11-08 NOTE — Patient Instructions (Signed)
No change in medicines  Call us back to re verify your furosemide dose - pill mg and how much you are taking  Checking labs today for diabetes/ cholesterol/ and blood count (iron)  Follow up with me in 6 months

## 2010-11-08 NOTE — Progress Notes (Signed)
Subjective:    Patient ID: Michelle Blair, female    DOB: 1920/10/08, 77 y.o.   MRN: [id number]  HPI  Here for f/u of DM and HTN and hyperlipidemia and hyperparathyroidism   meds she takes in am amlodipine and furosemide and lisinopril -- is that ok to all take together ?  Takes 1/2 furosemide-- and is unsure of total dose-will check bottle and call back with that     DM is fairly stable - in general we allow a1c to be higher than goal to avoid hypoglycemia which pt is very prone to  On umalog and checks sugars very frequently Excellent diet Stays as active as she can Lab Results  Component Value Date   HGBA1C 7.3* 07/26/2010    HTN is fairly controlled at 140/80 first check today  This is about as well controlled as we can get it with current med intolerances  Pt also has a hx of ? Syncope which she refuses work up for   Hyperparathyroid is stable with ca of 10.9--continue to watch this Her surgeon Dr Derrell Lolling wants to be alerted if numbers suddenly change Does want to avoid surgery  Pt does have OP with fx -- which she also refuses tx or further w/u of   Last lipids were excellent with HDL of 69 and LDL of 57 Good diet  Also takes lipitor   Wants to check her iron today  Is taking baby asa with iron   Her wt is down 1 lb Eats very well - she eats balanced meals to keep her sugar as stable as possible  She only eats dessert if sugar level is appropriate for that  Is more active lately also  A lot of dental work latley- hard to eat appropriately   oa is taken care of by tylenol - is doing well   Past Medical History  Diagnosis Date  . Diabetes mellitus   . GERD (gastroesophageal reflux disease)   . Hyperlipidemia   . Hypertension   . Spinal compression fracture   . Osteoporosis   . Thyroid disease   . Venous insufficiency     Past Surgical History  Procedure Date  . Fracture surgery 07/23/2000    hip, L2 compression fracture     History   Social History    . Marital Status: Widowed    Spouse Name: N/A    Number of Children: 4  . Years of Education: N/A   Occupational History  . Retired    Social History Main Topics  . Smoking status: Never Smoker   . Smokeless tobacco: Not on file  . Alcohol Use: No  . Drug Use: No  . Sexually Active:    Other Topics Concern  . Not on file   Social History Narrative  . No narrative on file    Family History  Problem Relation Age of Onset  . Cancer Father     colon         Review of Systems  Constitutional: Negative for activity change, appetite change and unexpected weight change.  Eyes: Negative for pain and visual disturbance.  Respiratory: Negative for shortness of breath and wheezing.   Cardiovascular: Negative.   Gastrointestinal: Negative for nausea, abdominal pain and diarrhea.  Genitourinary: Negative for urgency and frequency.  Musculoskeletal: Positive for back pain and arthralgias. Negative for joint swelling.  Skin: Negative for color change, pallor, rash and wound.  Neurological: Negative for weakness, light-headedness, numbness and headaches.  Hematological:  Negative for adenopathy. Does not bruise/bleed easily.  Psychiatric/Behavioral: Negative for dysphoric mood. The patient is nervous/anxious.        Objective:   Physical Exam  Constitutional: She appears well-developed and well-nourished.       Elderly frail appearing female  HENT:  Head: Normocephalic and atraumatic.  Eyes: Conjunctivae and EOM are normal. Pupils are equal, round, and reactive to light.  Neck: Normal range of motion. Neck supple. No JVD present.  Cardiovascular: Normal rate, regular rhythm and normal heart sounds.   Pulmonary/Chest: Effort normal and breath sounds normal.  Abdominal: Soft. Bowel sounds are normal. She exhibits no abdominal bruit and no mass. There is no tenderness.  Musculoskeletal: She exhibits tenderness. She exhibits no edema.  Lymphadenopathy:    She has no cervical  adenopathy.  Neurological: She is alert. She has normal reflexes. No cranial nerve deficit. Coordination normal.  Skin: Skin is warm and dry. No rash noted. No pallor.  Psychiatric: She has a normal mood and affect.          Assessment & Plan:

## 2010-11-08 NOTE — Assessment & Plan Note (Signed)
Continue to watch ca level- has been stable Pt asymptomatic  Has OP - declines tx  Continue to correspond with dr Derrell Lolling if needed

## 2010-11-08 NOTE — Assessment & Plan Note (Signed)
This has imp in past with small iron amt in [facility]  Check cbc today likley from chronic dz Renal fxn is stable

## 2010-11-09 ENCOUNTER — Other Ambulatory Visit: Payer: Self-pay | Admitting: Family Medicine

## 2010-11-09 ENCOUNTER — Telehealth: Payer: Self-pay | Admitting: *Deleted

## 2010-11-09 LAB — RENAL FUNCTION PANEL
Albumin: 3.8 g/dL (ref 3.5–5.2)
BUN: 19 mg/dL (ref 6–23)
Calcium: 10.9 mg/dL — ABNORMAL HIGH (ref 8.4–10.5)
Chloride: 104 mEq/L (ref 96–112)
Glucose, Bld: 131 mg/dL — ABNORMAL HIGH (ref 70–99)
Potassium: 3.6 mEq/L (ref 3.5–5.1)

## 2010-11-09 LAB — CBC WITH DIFFERENTIAL/PLATELET
Basophils Relative: 1 % (ref 0.0–3.0)
Eosinophils Relative: 0.3 % (ref 0.0–5.0)
Hemoglobin: 12 g/dL (ref 12.0–15.0)
MCV: 97.6 fl (ref 78.0–100.0)
Monocytes Absolute: 0.4 10*3/uL (ref 0.1–1.0)
Neutro Abs: 7.5 10*3/uL (ref 1.4–7.7)
Neutrophils Relative %: 80.2 % — ABNORMAL HIGH (ref 43.0–77.0)
RBC: 3.57 Mil/uL — ABNORMAL LOW (ref 3.87–5.11)
WBC: 9.4 10*3/uL (ref 4.5–10.5)

## 2010-11-09 LAB — LIPID PANEL
Cholesterol: 142 mg/dL (ref 0–200)
LDL Cholesterol: 72 mg/dL (ref 0–99)
Triglycerides: 56 mg/dL (ref 0.0–149.0)

## 2010-11-09 MED ORDER — FUROSEMIDE 40 MG PO TABS: 20.0000 mg | ORAL_TABLET | Freq: Every day | ORAL | Status: DC

## 2010-11-09 NOTE — Telephone Encounter (Signed)
Thanks for the update- I changed it in her med list

## 2010-11-09 NOTE — Telephone Encounter (Signed)
Patient called to let you know that she is taking Furosemide 40 mg and takes 1/2 every day and has been doing this for several years.

## 2010-12-08 NOTE — Discharge Summary (Signed)
[place]  Patient:    Michelle Blair, Michelle Blair Visit Number: [id number] MRN: [id number]          Service Type: ECR Location: SACU 4524 02 Attending Physician:  Herold Harms Dictated by:   Cornell Barman, P.A. Adm. Date:  03/12/2001 Disc. Date: 03/12/01   CC:         Rosalyn Gess. Norins, M.D. [facility]   Discharge Summary  DISCHARGE DIAGNOSES: 1. Occult left hip fracture. 2. Insulin-dependent diabetes mellitus. 3. Hypertension.  BRIEF ADMISSION HISTORY:  [name] is a 77 year old white female who fell and injured her left hip.  The patient was evaluated in the emergency department.  She was found to be hypoglycemic.  The patient did state that she took her 75/25 Insulin this morning, but did not eat.  She was treated with D50 water.  X-rays of her left hip were obtained, but were negative for fracture.  The patient was admitted for pain control.  PAST MEDICAL HISTORY: 1. Adult onset diabetes mellitus. 2. Hypertension. 3. Hypercholesterolemia. 4. History of lumbar radiculopathy at L3-4. 5. History of hypercalcemia aggravated by diuretic therapy,    hydrochlorothiazide in particular. 6. Tear to the left obturator internus muscle by prior laminectomy at right    L5-S1.  HOSPITAL COURSE: #1 - LEFT HIP PAIN:  The patient was admitted to rule out occult fracture. MRI of the left hip did reveal a fracture.  We did have orthopedics consult. They recommended conservative treatment, including touchdown weightbearing, physical therapy consult, and rehabilitation consult.  The patient did work with physical therapy.  Rehabilitation saw the patient and stated that she would benefit from a subacute rehabilitation stay.  Arrangements have been made for the patient to transfer to the subacute rehabilitation unit.  #2 - INSULIN-DEPENDENT DIABETES MELLITUS:  As noted, the patient presented with hypoglycemia as she had taken her insulin, but had no eaten.   This did resolve.  Her regimen has been changed from 75/25 to Humalog.  The patients blood sugars have been running high and we are titrating her Humalog to provide better glycemic control.  LABORATORY DATA PRIOR TO DISCHARGE:  The urine culture from March 07, 2001, revealed 30,000 colonies of multiple species.  The urinalysis was negative. The hemoglobin A1C was 6.1%.  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  MRI of the left hip showed an acute left femoral intertrochanteric fracture.  MEDICATIONS AT TRANSFER: 1. Zestril 10 mg q.d. 2. Lipitor 10 mg q.d. 3. Colace 100 mg q.d. 4. Hydrochlorothiazide 12.5 mg q.d. 5. Humalog 12 units t.i.d. with meals to be titrated. 6. Protonix 40 mg q.d.  FOLLOW-UP:  The patient should follow up with Rosalyn Gess. Norins, M.D., two to three weeks after discharge from the subacute rehabilitation unit. Dictated by:   Cornell Barman, P.A. Attending Physician:  Herold Harms DD:  03/12/01 TD:  03/12/01 Job: [id number]

## 2010-12-08 NOTE — Discharge Summary (Signed)
[facility]  Patient:    Michelle Blair, Michelle Blair Visit Number: [id number] MRN: [id number]          Service Type: ECR Location: SACU 4530 01 Attending Physician:  Herold Harms Dictated by:   Junie Bame, P.A. Admit Date:  03/12/2001 Discharge Date: 04/01/2001   CC:         Nadara Mustard, M.D.  Rosalyn Gess Norins, M.D. [facility]   Discharge Summary  DISCHARGE DIAGNOSES: 1. Status post left IT fracture. 2. Diabetes mellitus. 3. Hypertension. 4. History of hypercholesterolemia.  HISTORY OF PRESENT ILLNESS:  The patient is a 77 year old white female with a past medical history of insulin-dependent diabetes mellitus and hypertension, evaluated at [facility] Emergency Room for inability to ambulate after fall and striking head.  After the fall the patient was complaining of dizziness and hip pain.  X-rays on 03/05/01, revealed no acute abnormalities, but an MRI revealed a left hip fracture (IT fracture of the left femur). Ortho states the patient may need open reduction internal fixation, no surgical interventions at this time.  Orthopedic doctors were Dr. Lajoyce Corners.  She is also being followed by internal medicine.  Hospital course was significant for increased blood pressure, low grade fever, and increased CBGs.  Physical therapy at that time indicated that the patient was ambulating at minimal guard assist 3 feet with standard walker, and is touchdown weightbearing. Transfer sit-to-stand minimal guard assist.  She is on subcutaneous heparin for deep venous thrombosis prophylaxis.  PAST MEDICAL HISTORY: 1. Insulin-dependent diabetes mellitus. 2. Hypertension. 3. Hypercholesterolemia. 4. Hypocalcemia. 5. Lumbar radiculopathy.  PAST SURGICAL HISTORY:  Back surgery.  PRIMARY CARE PHYSICIAN:  Dr. Debby Bud.  ADMISSION MEDICATIONS: 1. Zestril 10 mg q.d. 2. Lipitor 10 mg q.d. 3. Hydrochlorothiazide is on hold. 4. Humalog 75/25 16 units q.a.m.  and 5 units q.p.m.  FAMILY HISTORY:  Noncontributory.  SOCIAL HISTORY:  The patient lives in one level home alone in [place]. There are two steps to entry.  Sons are very supportive.  She was independent prior to admission.  Husband has passed away.  ALLERGIES:  SULFA.  REVIEW OF SYSTEMS:  Significant for diabetes mellitus, joint pain, and impaired vision.  HOSPITAL COURSE:  Mrs. Barrasso was admitted to [facility] on March 12, 2001, where she received physical therapy and occupational therapy as tolerated.  Her hospital course is significant for increased CBGs and increased blood pressures.  Mrs. Sarin was on subcutaneous Lovenox for deep venous thrombosis prophylaxis during entire stay, and was placed on Protonix for GI protection and prophylaxis.  During the first two days of SACU her blood pressure began to elevate.  She was on Zestril 10 mg p.o. q.d., and hydrochlorothiazide 12.5 mg p.o. q.d., which eventually had to be increased to 25 mg p.o. q.d. to keep blood pressure within a normal limit range.  She progressed very well in rehab.  Her CBGs were elevated mostly during her stay in SACU.  She was on Lisper 12 units t.i.d., and because of elevated CBGs, she had to be switched to her home dose of Humalog 75/25 16 units q.a.m. and 5 units q.p.m., and CBGs began to improve significantly.  Latest labs indicated that her potassium was 4.0, sodium 140, BUN 13, creatinine 0.4.  CO2 was 33, glucose was 245.  Hemoglobin was 11.1, hematocrit 32.5, white blood cell count 6.9, platelets 218.  At the time of discharge her vitals were stable.  CBGs were running from 174 to 149,  158, 147.  PT report at that time indicated the patient was ambulating modified independent 50 feet, touchdown weightbearing, and could sit-to-stand, and modified independent with rolling walker.  She performed most activities of daily living modified independently or with supervision.  She was discharged home to her  son.  DISCHARGE MEDICATIONS: 1. Zestril 10 mg q.d. 2. Hydrochlorothiazide 25 mg one tab q.d. 3. Lipitor 10 mg q.h.s. 4. Humalog 75/25 16 units q.a.m. and 5 units q.p.m. 5. Oxycodone 5 mg one tab q.4-6h. p.r.n. pain.  ACTIVITY:  She is to use her walker.  No driving.  DIET:  No concentrated sweets, low sodium, low cholesterol.  She is to eat all her meals, and CBGs checked at least twice a week.  She will receive [facility] for physical therapy and occupational therapy and nurse aid in nursing.  FOLLOWUP: 1. Dr. Thomasena Edis p.r.n. 2. Dr. Lajoyce Corners in two weeks. 3. Dr. Debby Bud in 4 to 6 weeks. Dictated by:   Junie Bame, P.A. Attending Physician:  Herold Harms DD:  04/09/01 TD:  04/09/01 Job: [id number]

## 2010-12-08 NOTE — H&P (Signed)
[place]  Patient:    Michelle Blair, Michelle Blair                     MRN: [id number] Adm. Date:  [id number] Attending:  Lorre Nick CC:         Rosalyn Gess. Norins, M.D. [facility]   History and Physical  CHIEF COMPLAINT:  Left hip pain.  HISTORY OF PRESENT ILLNESS:  The patient is a 77 year old white female with a history of insulin-dependent diabetes.  She was in her usual state of health until the day of admission, when she took her usual a.m. dose of Humalog 75/25, 16 units.  Due to some sinus congestion, she got distracted and omitted her breakfast.  She apparently was busy using steam heat to clear her sinuses and neglected any oral intake.  She became quite dizzy and fell, sustaining an injury to her left hip area and left frontal scalp area.  In the emergency room, the patient required infusion of D-50 W x 2 for control of her hypoglycemia.  After clinical stabilization, the patient was still quite uncomfortable and was unable to ambulate due to left hip pain.  Radiographs revealed no obvious fracture.  The patient is now admitted for further evaluation of her left hip pain and for pain control and diabetic management.  PAST MEDICAL HISTORY:  The patient has a history of adult-onset diabetes controlled with insulin over the past year or two.  Other medical problems include hypertension, hypercholesterolemia.  She has a history also of lumbar radiculopathy at the left L3-L4 level.  She has a history also of hypercalcemia.  Apparently this may have been aggravated by diuretic therapy, and hydrochlorothiazide has been discontinued.  One year ago, the patient was evaluated and sustained a tear of the left obturator internus muscle.  She has had MRI evidence of a prior laminectomy at the right L5-S1 area.  MEDICATION REGIMEN: 1. Zestril 10 mg daily. 2. Lipitor 10 mg daily. 3. Hydrochlorothiazide, held. 4. Humalog 75/25, 16 units in the morning and 5  units in the evening.  SOCIAL HISTORY:  The patient lives at home independently.  Apparently her husband is in a nursing home.  The patient does have a supportive family.  PHYSICAL EXAMINATION:  VITAL SIGNS:  Blood pressure 130/70.  GENERAL:  Well-developed, pleasant, thin, white female, in no acute distress at rest.  SKIN:  Unremarkable except for a fresh laceration of approximately 3 cm involving her left supraorbital area.  HEENT:  Pupil responses are normal.  Conjunctivae clear.  ENT negative. Oropharynx benign.  Dentures are in place.  NECK:  No neck vein distention, bruits, or adenopathy.  CHEST:  Clear anterolaterally.  BREAST:  Negative.  CARDIOVASCULAR:  Normal S1, S2.  There are only rare ectopics.  No murmurs or gallops.  ABDOMEN:  Soft and nontender.  No surgical scars.  No organomegaly.  No bruits appreciated.  EXTREMITIES:  No edema.  Dorsalis pedis pulses are full.  Posterior tibial pulses are not easily palpable.  There is no shortening or rotation of the left leg.  Gentle manipulation of the right leg, however, elicited severe pain in the left hip area.  The patient was unable to flex her left hip more than approximately 20-30 degrees.  There is also a localized tenderness but without hematoma or ecchymosis involving the left lateral hip area.  NEUROLOGIC:  Negative.  IMPRESSION: 1. Insulin-dependent diabetes with hypoglycemic episode. 2. Left hip pain, rule out occult  fracture. 3. Abnormal CT scan, consistent with a possible shearing-type injury of the    left middle cerebral peduncle versus a cavernous hemangioma. 4. Hypertension. 5. Hyperlipidemia.  DISPOSITION:  The patient will be admitted to the hospital for pain control and observation.  Serial blood sugars will be monitored.  If the patient is still severely symptomatic in the morning, will require further radiographic evaluation, possible orthopedic consult. DD:  03/05/01 TD:   03/05/01 Job: [id number]

## 2010-12-28 ENCOUNTER — Other Ambulatory Visit: Payer: Self-pay | Admitting: Family Medicine

## 2010-12-28 NOTE — Telephone Encounter (Signed)
Medication phoned to [facility] pharmacy [phone] as instructed.

## 2010-12-28 NOTE — Telephone Encounter (Signed)
Px written for call in   

## 2011-01-11 ENCOUNTER — Other Ambulatory Visit: Payer: Self-pay | Admitting: Family Medicine

## 2011-01-31 ENCOUNTER — Encounter: Payer: Self-pay | Admitting: Family Medicine

## 2011-02-12 ENCOUNTER — Encounter: Payer: Self-pay | Admitting: Podiatry

## 2011-02-22 ENCOUNTER — Other Ambulatory Visit: Payer: Self-pay | Admitting: Family Medicine

## 2011-03-01 ENCOUNTER — Other Ambulatory Visit: Payer: Self-pay | Admitting: Family Medicine

## 2011-03-01 NOTE — Telephone Encounter (Signed)
[facility] request refill for Amlodipine 5mg  #30 x 3.

## 2011-03-08 ENCOUNTER — Other Ambulatory Visit: Payer: Self-pay | Admitting: Family Medicine

## 2011-03-08 NOTE — Telephone Encounter (Signed)
[facility] electronically request refill for Humalog Mix 75/25 kwikpen. The instructions on requested refill are inject 15 units each morning and 5 units each evening. Pt last seen 11/08/10 and  Looks like instructions on that visit were as directed. Please advise.

## 2011-03-08 NOTE — Telephone Encounter (Signed)
Will refill electronically  

## 2011-03-21 ENCOUNTER — Ambulatory Visit (INDEPENDENT_AMBULATORY_CARE_PROVIDER_SITE_OTHER): Payer: Medicare Other | Admitting: Family Medicine

## 2011-03-21 ENCOUNTER — Encounter: Payer: Self-pay | Admitting: Family Medicine

## 2011-03-21 VITALS — BP 146/76 | HR 84 | Temp 98.4°F | Ht 65.0 in | Wt 101.2 lb

## 2011-03-21 DIAGNOSIS — L989 Disorder of the skin and subcutaneous tissue, unspecified: Secondary | ICD-10-CM

## 2011-03-21 DIAGNOSIS — L84 Corns and callosities: Secondary | ICD-10-CM | POA: Insufficient documentation

## 2011-03-21 NOTE — Progress Notes (Signed)
Subjective:    Patient ID: Michelle Blair, female    DOB: 01-27-1921, 77 y.o.   MRN: [id number]  HPI Has a callus under bottom of her foot - under the base of 2nd toe Is very painful at times - and hard to walk on it  Tried to soak it and put moisturizer on it -- but that did not help   It really interferes with her ambulation   Thinks she needs some help with that   No injury and has not dropped anything on it   Is otherwise feeling fine  Sugars are stable  Afraid of loosing too much weight - but does not want high sugars  Patient Active Problem List  Diagnoses  . DIABETES MELLITUS, TYPE I  . HYPERLIPIDEMIA  . SYNDROME, CHRONIC PAIN  . MACULAR DEGENERATION  . HYPERTENSION  . VENOUS INSUFFICIENCY  . GERD  . CONSTIPATION  . BLIND LOOP SYNDROME  . OSTEOPOROSIS NOS  . EDEMA LEG  . COMPRESSION FRACTURE, SPINE  . FRACTURE, WRIST, LEFT  . HYPERPARATHYROIDISM, HX OF  . SYNCOPE, HX OF  . Anemia  . Pre-ulcerative corn or callous  . Skin lesion of right leg   Past Medical History  Diagnosis Date  . Diabetes mellitus   . GERD (gastroesophageal reflux disease)   . Hyperlipidemia   . Hypertension   . Spinal compression fracture   . Osteoporosis   . Thyroid disease   . Venous insufficiency    Past Surgical History  Procedure Date  . Fracture surgery 07/23/2000    hip, L2 compression fracture    History  Substance Use Topics  . Smoking status: Never Smoker   . Smokeless tobacco: Not on file  . Alcohol Use: No   Family History  Problem Relation Age of Onset  . Cancer Father     colon   Allergies  Allergen Reactions  . Aspirin   . Sulfonamide Derivatives    Current Outpatient Prescriptions on File Prior to Visit  Medication Sig Dispense Refill  . ACIPHEX 20 MG tablet TAKE ONE TABLET BY MOUTH DAILY AS NEEDED FOR REFLUX  30 each  5  . amLODipine (NORVASC) 5 MG tablet TAKE ONE TABLET BY MOUTH EVERY DAY FOR BLOOD PRESSURE  30 tablet  3  . atorvastatin  (LIPITOR) 10 MG tablet TAKE 1 TABLET IN THE EVENING FOR CHOLESTEROL  30 tablet  11  . calcium-vitamin D (OSCAL WITH D 500-200) 500-200 MG-UNIT per tablet Take 600 mg by mouth two times daily       . furosemide (LASIX) 40 MG tablet Take 0.5 tablets (20 mg total) by mouth daily.  15 tablet  5  . glucose blood test strip Check blood sugar 6 times daily and as needed for insulin dependent diabetes 250.01       . HUMALOG MIX 75/25 KWIKPEN (75-25) 100 UNIT/ML SUSP INJECT 15 UNITS EACH MORNING AND 5 UNITS EACH EVENING.  15 mL  11  . Insulin Pen Needle (BD PEN NEEDLE NANO U/F) 32G X 4 MM MISC Inject twice daily or as directed       . Lancets MISC Check blood sugar 6 times daily and as needed for insulin dependent diabetes.       Marland Kitchen lisinopril (PRINIVIL,ZESTRIL) 20 MG tablet TAKE 1 & 1/2 TABLETS BY MOUTH EVERY MORNING  45 tablet  3  . Pediatric Multivitamins-Iron (FLINTSTONES PLUS IRON) chewable tablet Chew 1 tablet by mouth daily.        Marland Kitchen  potassium chloride SA (K-DUR,KLOR-CON) 20 MEQ tablet Take 20 mEq by mouth daily.        . vitamin E 400 UNIT capsule Take 400 Units by mouth daily.        Marland Kitchen nystatin (MYCOSTATIN) 100000 UNIT/ML suspension One teaspoon swish and swallow three times daily for 7-10 days (until better)           Review of Systems Review of Systems  Constitutional: Negative for fever, appetite change, fatigue and unexpected weight change.  Eyes: Negative for pain and visual disturbance.  Respiratory: Negative for cough and shortness of breath.   Cardiovascular: Negative.  for cp or palp Gastrointestinal: Negative for nausea, diarrhea and constipation.  Genitourinary: Negative for urgency and frequency.  Skin: Negative for pallor. or rash  MSK pos for foot pain , pos for OA of many joints  Neurological: Negative for weakness, light-headedness, numbness and headaches.  Hematological: Negative for adenopathy. Does not bruise/bleed easily.  Psychiatric/Behavioral: Negative for dysphoric  mood. The patient is not nervous/anxious.          Objective:   Physical Exam  Constitutional: She appears well-developed and well-nourished. No distress.       Frail appearing elderly female in no distress  Ambulates very slowly with a cane  HENT:  Head: Normocephalic and atraumatic.  Mouth/Throat: Oropharynx is clear and moist.  Eyes: Conjunctivae and EOM are normal. Pupils are equal, round, and reactive to light.  Neck: Normal range of motion. Neck supple.  Cardiovascular: Normal rate, regular rhythm, normal heart sounds and intact distal pulses.   Pulmonary/Chest: Effort normal and breath sounds normal. No respiratory distress. She has no wheezes.  Musculoskeletal: She exhibits tenderness. She exhibits no edema.       Tender over callus under 2nd metatarsal head on L foot  Changes of OA throughout   Lymphadenopathy:    She has no cervical adenopathy.  Neurological: She is alert. She has normal reflexes. Coordination normal.  Skin: Skin is warm and dry. No rash noted. No erythema. No pallor.       karatotic raised lesion 3-4 mm that is tender on R anterior shin  Psychiatric: She has a normal mood and affect.          Assessment & Plan:

## 2011-03-21 NOTE — Patient Instructions (Signed)
Keep spot on foot clean and dry  Keep using moisturizer Soaking is fine  You can pick up a corn pad to put over it (this offloads the pressure) at a drug store Let me know if foot hurts or gets worse  We will do dermatology referral for spot on right leg when you check out

## 2011-03-22 ENCOUNTER — Other Ambulatory Visit: Payer: Self-pay | Admitting: Family Medicine

## 2011-03-22 NOTE — Assessment & Plan Note (Signed)
See overview.   

## 2011-04-26 ENCOUNTER — Other Ambulatory Visit: Payer: Self-pay | Admitting: Family Medicine

## 2011-04-26 NOTE — Telephone Encounter (Signed)
[facility] request refill BD pen needle 32G x 4mm.# 100 x 3.

## 2011-05-03 ENCOUNTER — Other Ambulatory Visit: Payer: Self-pay | Admitting: Family Medicine

## 2011-05-03 NOTE — Telephone Encounter (Signed)
[facility] request refill Lisinopril 20 mg # 45 x 1. Pt already scheduled appt with Dr Milinda Antis 05/09/11.

## 2011-05-09 ENCOUNTER — Encounter: Payer: Self-pay | Admitting: Family Medicine

## 2011-05-09 ENCOUNTER — Ambulatory Visit (INDEPENDENT_AMBULATORY_CARE_PROVIDER_SITE_OTHER): Payer: Medicare Other | Admitting: Family Medicine

## 2011-05-09 ENCOUNTER — Ambulatory Visit: Payer: Medicare Other | Admitting: Family Medicine

## 2011-05-09 VITALS — BP 124/68 | HR 84 | Temp 98.6°F | Wt 102.2 lb

## 2011-05-09 DIAGNOSIS — L989 Disorder of the skin and subcutaneous tissue, unspecified: Secondary | ICD-10-CM

## 2011-05-09 DIAGNOSIS — Z23 Encounter for immunization: Secondary | ICD-10-CM

## 2011-05-09 DIAGNOSIS — E785 Hyperlipidemia, unspecified: Secondary | ICD-10-CM

## 2011-05-09 DIAGNOSIS — E109 Type 1 diabetes mellitus without complications: Secondary | ICD-10-CM

## 2011-05-09 DIAGNOSIS — Z862 Personal history of diseases of the blood and blood-forming organs and certain disorders involving the immune mechanism: Secondary | ICD-10-CM

## 2011-05-09 DIAGNOSIS — I10 Essential (primary) hypertension: Secondary | ICD-10-CM

## 2011-05-09 NOTE — Progress Notes (Signed)
Subjective:    Patient ID: Michelle Blair, female    DOB: 03-05-1921, 77 y.o.   MRN: [id number]  HPI Here for f/u of DM and HTN and hyperparathyroid and cholesterol review and foot pain   Feels ok in general but her body is wearing out - with arthritis  Also vision is worsening   She used some corn pads and they had an irritating chemical on them -- burned some of her skin  L foot  Last visit shaved down a corn and that really did help  Saw Dr Terri Piedra about the area on her R leg/ shin - took it off and is taking a while to heal Takes bandage on and off     Wt is up 1 lb with bmi of 17- her baseline  Flu shot- wants to get that today   DM last a1c 7.6-- allow it to be this high to prev hypoglycemia  Sugars at home are labile - no change, however -she checks it 6 times per day -- usually does not take insulin unless her sugar 140 -- that is how she manages it  Has to eat a snack at bedtime or will bottom out  Keeps coke around if she needs it for low sugar Is generally drinking more water too  Very balanced diet - if no meat gets more protein  opthy utd 3/12 , no retinopathy but is gradually loosing vision    Lipids very good with lipitor and diet in April Lab Results  Component Value Date   CHOL 142 11/08/2010   CHOL 135 12/28/2009   CHOL 128 04/08/2009   Lab Results  Component Value Date   HDL 59.00 11/08/2010   HDL 40.98 12/28/2009   HDL 51.30 04/08/2009   Lab Results  Component Value Date   LDLCALC 72 11/08/2010   LDLCALC 69 12/28/2009   LDLCALC 67 04/08/2009   Lab Results  Component Value Date   TRIG 56.0 11/08/2010   TRIG 43.0 12/28/2009   TRIG 47.0 04/08/2009   Lab Results  Component Value Date   CHOLHDL 2 11/08/2010   CHOLHDL 2 12/28/2009   CHOLHDL 2 04/08/2009   No results found for this basename: LDLDIRECT     Hyperparathyroid - watching ca Has OP with fx and declines tx  If ca changes - plan is to alert Dr Derrell Lolling- pt does not want intervention ,  however  Also takes a multivitamin   HTN in good control Great bp today at 124/68 No cp or ha or palpitations   Patient Active Problem List  Diagnoses  . DIABETES MELLITUS, TYPE I  . HYPERLIPIDEMIA  . SYNDROME, CHRONIC PAIN  . MACULAR DEGENERATION  . HYPERTENSION  . VENOUS INSUFFICIENCY  . GERD  . CONSTIPATION  . BLIND LOOP SYNDROME  . OSTEOPOROSIS NOS  . EDEMA LEG  . COMPRESSION FRACTURE, SPINE  . FRACTURE, WRIST, LEFT  . HYPERPARATHYROIDISM, HX OF  . SYNCOPE, HX OF  . Anemia  . Pre-ulcerative corn or callous  . Skin lesion of right leg   Past Medical History  Diagnosis Date  . Diabetes mellitus   . GERD (gastroesophageal reflux disease)   . Hyperlipidemia   . Hypertension   . Spinal compression fracture   . Osteoporosis   . Thyroid disease   . Venous insufficiency    Past Surgical History  Procedure Date  . Fracture surgery 07/23/2000    hip, L2 compression fracture    History  Substance Use Topics  .  Smoking status: Never Smoker   . Smokeless tobacco: Not on file  . Alcohol Use: No   Family History  Problem Relation Age of Onset  . Cancer Father     colon   Allergies  Allergen Reactions  . Aspirin   . Sulfonamide Derivatives    Current Outpatient Prescriptions on File Prior to Visit  Medication Sig Dispense Refill  . amLODipine (NORVASC) 5 MG tablet TAKE ONE TABLET BY MOUTH EVERY DAY FOR BLOOD PRESSURE  30 tablet  3  . atorvastatin (LIPITOR) 10 MG tablet TAKE 1 TABLET IN THE EVENING FOR CHOLESTEROL  30 tablet  11  . BD PEN NEEDLE NANO U/F 32G X 4 MM MISC INJECT TWICE DAILY OR AS DIRECTED.  100 each  3  . calcium-vitamin D (OSCAL WITH D 500-200) 500-200 MG-UNIT per tablet Take 600 mg by mouth two times daily       . furosemide (LASIX) 40 MG tablet TAKE ONE-HALF TABLET BY MOUTH EVERY MORNING FOR FLUID RETENTION  45 tablet  0  . glucose blood test strip Check blood sugar 6 times daily and as needed for insulin dependent diabetes 250.01       .  HUMALOG MIX 75/25 KWIKPEN (75-25) 100 UNIT/ML SUSP INJECT 15 UNITS EACH MORNING AND 5 UNITS EACH EVENING.  15 mL  11  . Lancets MISC Check blood sugar 6 times daily and as needed for insulin dependent diabetes.       Marland Kitchen lisinopril (PRINIVIL,ZESTRIL) 20 MG tablet TAKE 1 & 1/2 TABLETS BY MOUTH EVERY MORNING  45 tablet  1  . Pediatric Multivitamins-Iron (FLINTSTONES PLUS IRON) chewable tablet Chew 1 tablet by mouth daily.        . potassium chloride SA (K-DUR,KLOR-CON) 20 MEQ tablet Take 20 mEq by mouth daily.        . vitamin E 400 UNIT capsule Take 400 Units by mouth daily.        . ACIPHEX 20 MG tablet TAKE ONE TABLET BY MOUTH DAILY AS NEEDED FOR REFLUX  30 each  5  . nystatin (MYCOSTATIN) 100000 UNIT/ML suspension One teaspoon swish and swallow three times daily for 7-10 days (until better)           Review of Systems Review of Systems  Constitutional: Negative for fever, appetite change, fatigue and unexpected weight change.  Eyes: Negative for pain and pos for worsening vision Respiratory: Negative for cough and shortness of breath.   Cardiovascular: Negative for cp or palpitations    Gastrointestinal: Negative for nausea, diarrhea and constipation.  Genitourinary: Negative for urgency and frequency.no excess thirst  Skin: Negative for pallor or rash  pos for foot pain from calluses that is improved  Neurological: Negative for weakness, light-headedness, numbness and headaches.  Hematological: Negative for adenopathy. Does not bruise/bleed easily.  Psychiatric/Behavioral: Negative for dysphoric mood. The patient is not nervous/anxious.          Objective:   Physical Exam  Constitutional: She appears well-developed and well-nourished. No distress.       Frail appearing elderly female  HENT:  Head: Normocephalic and atraumatic.  Right Ear: External ear normal.  Left Ear: External ear normal.  Mouth/Throat: Oropharynx is clear and moist.  Eyes: Conjunctivae and EOM are normal.  Pupils are equal, round, and reactive to light.  Neck: Normal range of motion. Neck supple. No JVD present. Carotid bruit is not present. No thyromegaly present.  Cardiovascular: Normal rate, regular rhythm, normal heart sounds and intact distal pulses.  Exam reveals no gallop.   Pulmonary/Chest: Effort normal and breath sounds normal. No respiratory distress. She has no wheezes.  Abdominal: Soft. Bowel sounds are normal. She exhibits no distension, no abdominal bruit and no mass. There is no tenderness.  Musculoskeletal: Normal range of motion. She exhibits no edema.  Lymphadenopathy:    She has no cervical adenopathy.  Neurological: She is alert. She has normal reflexes. No cranial nerve deficit. Coordination normal.  Skin: Skin is warm and dry. No rash noted. No erythema.       Callus on bottom of L foot is smaller but still slt tender Has both large toes wrapped to prevent friction- no injury seen  Wound on R shin from prev mole removal is healing well without s/s of inf and healthy app granulation tissue  Psychiatric: She has a normal mood and affect.       Talkative and tangential  Confuses easily  Very pleasant          Assessment & Plan:

## 2011-05-09 NOTE — Patient Instructions (Signed)
Flu shot today Labs today  Leg looks like it is healing well - keep it clean (cover it at night ) Labs today  Follow up in 6 months for a 30 minute check up with labs prior

## 2011-05-10 ENCOUNTER — Telehealth: Payer: Self-pay | Admitting: Family Medicine

## 2011-05-10 LAB — RENAL FUNCTION PANEL
Albumin: 4 g/dL (ref 3.5–5.2)
BUN: 16 mg/dL (ref 6–23)
CO2: 33 mEq/L — ABNORMAL HIGH (ref 19–32)
Chloride: 104 mEq/L (ref 96–112)
Creatinine, Ser: 0.7 mg/dL (ref 0.4–1.2)

## 2011-05-10 MED ORDER — POTASSIUM CHLORIDE CRYS ER 20 MEQ PO TBCR: 20.0000 meq | EXTENDED_RELEASE_TABLET | Freq: Two times a day (BID) | ORAL | Status: DC

## 2011-05-10 NOTE — Assessment & Plan Note (Signed)
Continue to watch ca level -and if big change/ alert Dr Derrell Lolling  Pt does not want tx for this or OP at this time  Ca level today

## 2011-05-10 NOTE — Assessment & Plan Note (Signed)
Was B9 upon removal  The site is healing slowly without s /s of infx Disc wound care and re dressed with band aid today

## 2011-05-10 NOTE — Telephone Encounter (Signed)
Please let pt know that her sugar control is improved K is low so I want her to take her K bid instead of one daily  Px written for call in   Please re check K for hypokalemia in 2 weeks  Ca is fairly stable  Copy to her surgeon (I think Dr Derrell Lolling) please - Will continue to watch this for her hyperparathyroidism

## 2011-05-10 NOTE — Assessment & Plan Note (Signed)
Overall stable - with good diet and frequent sugar checks (6 times per day)  Pt has last a1c 7.6 -allow this to be slt high in light of frequent hypoglycemia  Lab today opthy up to date  No change in insulin

## 2011-05-10 NOTE — Assessment & Plan Note (Signed)
This is well controlled with statin and diet - so will not check today- wait for next f/u  Rev last lab Rev low sat fat diet with pt

## 2011-05-10 NOTE — Assessment & Plan Note (Signed)
Very good bp today  No change in med Lab today F/u 6 mo

## 2011-05-11 ENCOUNTER — Ambulatory Visit: Payer: Medicare Other | Admitting: Family Medicine

## 2011-05-11 NOTE — Telephone Encounter (Signed)
Patient notified as instructed by telephone. Lab appt scheduled as instructed 05/30/11 at 2:30pm.copy of lab report faxed to Dr Derrell Lolling [phone].Medication phoned to [facility] pharmacy as instructed.

## 2011-05-11 NOTE — Telephone Encounter (Signed)
Left message for pt to call back. Also please very Dr Derrell Lolling is pts surgeon.

## 2011-05-29 ENCOUNTER — Other Ambulatory Visit: Payer: Self-pay | Admitting: Family Medicine

## 2011-05-29 DIAGNOSIS — E876 Hypokalemia: Secondary | ICD-10-CM

## 2011-05-30 ENCOUNTER — Other Ambulatory Visit (INDEPENDENT_AMBULATORY_CARE_PROVIDER_SITE_OTHER): Payer: Medicare Other

## 2011-05-30 ENCOUNTER — Encounter: Payer: Self-pay | Admitting: Family Medicine

## 2011-05-30 ENCOUNTER — Ambulatory Visit (INDEPENDENT_AMBULATORY_CARE_PROVIDER_SITE_OTHER): Payer: Medicare Other | Admitting: Family Medicine

## 2011-05-30 VITALS — BP 140/80 | HR 96 | Temp 98.6°F

## 2011-05-30 DIAGNOSIS — L02419 Cutaneous abscess of limb, unspecified: Secondary | ICD-10-CM

## 2011-05-30 DIAGNOSIS — L03119 Cellulitis of unspecified part of limb: Secondary | ICD-10-CM

## 2011-05-30 DIAGNOSIS — E876 Hypokalemia: Secondary | ICD-10-CM

## 2011-05-30 LAB — POTASSIUM: Potassium: 4 mEq/L (ref 3.5–5.1)

## 2011-05-30 MED ORDER — CEPHALEXIN 500 MG PO CAPS: 500.0000 mg | ORAL_CAPSULE | Freq: Three times a day (TID) | ORAL | Status: AC

## 2011-05-30 NOTE — Progress Notes (Signed)
Subjective:    Patient ID: Michelle Blair, female    DOB: 06-Jun-1921, 77 y.o.   MRN: [id number]  HPI  Had a nevus removed from leg by Dr Terri Piedra  Was given fluocinonide cream .05% Now has drainage from the spot and enlarging redness around it and swelling - streaks up the back  Some streaking  Does not really hurt She cannot see it very well  Feels ok   Sometimes itches   Wound was draining -not for a while   Saw Dr Terri Piedra one day last week -- was not red or streaking then Will go back in jan    No fever  Feels ok   Patient Active Problem List  Diagnoses  . DIABETES MELLITUS, TYPE I  . HYPERLIPIDEMIA  . SYNDROME, CHRONIC PAIN  . MACULAR DEGENERATION  . HYPERTENSION  . VENOUS INSUFFICIENCY  . GERD  . CONSTIPATION  . BLIND LOOP SYNDROME  . OSTEOPOROSIS NOS  . EDEMA LEG  . COMPRESSION FRACTURE, SPINE  . FRACTURE, WRIST, LEFT  . HYPERPARATHYROIDISM, HX OF  . SYNCOPE, HX OF  . Anemia  . Pre-ulcerative corn or callous  . Skin lesion of right leg  . Cellulitis, leg   Past Medical History  Diagnosis Date  . Diabetes mellitus   . GERD (gastroesophageal reflux disease)   . Hyperlipidemia   . Hypertension   . Spinal compression fracture   . Osteoporosis   . Thyroid disease   . Venous insufficiency    Past Surgical History  Procedure Date  . Fracture surgery 07/23/2000    hip, L2 compression fracture    History  Substance Use Topics  . Smoking status: Never Smoker   . Smokeless tobacco: Not on file  . Alcohol Use: No   Family History  Problem Relation Age of Onset  . Cancer Father     colon   Allergies  Allergen Reactions  . Aspirin   . Sulfonamide Derivatives    Current Outpatient Prescriptions on File Prior to Visit  Medication Sig Dispense Refill  . ACIPHEX 20 MG tablet TAKE ONE TABLET BY MOUTH DAILY AS NEEDED FOR REFLUX  30 each  5  . amLODipine (NORVASC) 5 MG tablet TAKE ONE TABLET BY MOUTH EVERY DAY FOR BLOOD PRESSURE  30 tablet  3  .  atorvastatin (LIPITOR) 10 MG tablet TAKE 1 TABLET IN THE EVENING FOR CHOLESTEROL  30 tablet  11  . BD PEN NEEDLE NANO U/F 32G X 4 MM MISC INJECT TWICE DAILY OR AS DIRECTED.  100 each  3  . calcium-vitamin D (OSCAL WITH D 500-200) 500-200 MG-UNIT per tablet Take 600 mg by mouth two times daily       . furosemide (LASIX) 40 MG tablet TAKE ONE-HALF TABLET BY MOUTH EVERY MORNING FOR FLUID RETENTION  45 tablet  0  . glucose blood test strip Check blood sugar 6 times daily and as needed for insulin dependent diabetes 250.01       . HUMALOG MIX 75/25 KWIKPEN (75-25) 100 UNIT/ML SUSP INJECT 15 UNITS EACH MORNING AND 5 UNITS EACH EVENING.  15 mL  11  . Lancets MISC Check blood sugar 6 times daily and as needed for insulin dependent diabetes.       Marland Kitchen lisinopril (PRINIVIL,ZESTRIL) 20 MG tablet TAKE 1 & 1/2 TABLETS BY MOUTH EVERY MORNING  45 tablet  1  . nystatin (MYCOSTATIN) 100000 UNIT/ML suspension One teaspoon swish and swallow three times daily for 7-10 days (until better)       .  Pediatric Multivitamins-Iron (FLINTSTONES PLUS IRON) chewable tablet Chew 1 tablet by mouth daily.        . potassium chloride SA (K-DUR,KLOR-CON) 20 MEQ tablet Take 1 tablet (20 mEq total) by mouth 2 (two) times daily.  60 tablet  11  . vitamin E 400 UNIT capsule Take 400 Units by mouth daily.             Review of Systems Review of Systems  Constitutional: Negative for fever, appetite change, fatigue and unexpected weight change.  Eyes: Negative for pain and visual disturbance.  Respiratory: Negative for cough and shortness of breath.   Cardiovascular: Negative for cp or palpitations    Gastrointestinal: Negative for nausea, diarrhea and constipation.  Genitourinary: Negative for urgency and frequency.  Skin: Negative for pallor or rash   Neurological: Negative for weakness, light-headedness, numbness and headaches.  Hematological: Negative for adenopathy. Does not bruise/bleed easily.  Psychiatric/Behavioral:  Negative for dysphoric mood. The patient is not nervous/anxious.          Objective:   Physical Exam  Constitutional: She appears well-developed and well-nourished. No distress.       Frail appearing elderly female  HENT:  Head: Normocephalic and atraumatic.  Eyes: Conjunctivae and EOM are normal. Pupils are equal, round, and reactive to light.  Neck: Normal range of motion. Neck supple. No JVD present.  Cardiovascular: Normal rate and regular rhythm.   Pulmonary/Chest: Effort normal and breath sounds normal.  Musculoskeletal: She exhibits edema and tenderness.  Lymphadenopathy:    She has no cervical adenopathy.  Skin:       R leg- from ankle to mid calf- faint erythema and edema with streaking up the back of calf slt tender - but not bony Wound itself on shin looks ok  Nl perfusion Foot unaffected  Psychiatric: She has a normal mood and affect.          Assessment & Plan:

## 2011-05-30 NOTE — Progress Notes (Signed)
Received note pt wanted Keflex sent to [facility] instead of [facility]. Medication phoned to [facility] pharmacy as instructed. [facility] notified to cancel rx.

## 2011-05-30 NOTE — Patient Instructions (Signed)
I think you may have some infection in your leg  Watch it closely -if increased redness or swelling or any pain or fever- call immediately please Take the keflex three times daily for 10 days- antibiotic - I sent this to your pharmacy Elevate your leg whenever you can Follow up next week for a re check

## 2011-05-31 ENCOUNTER — Other Ambulatory Visit: Payer: Self-pay | Admitting: *Deleted

## 2011-05-31 MED ORDER — GLUCOSE BLOOD VI STRP: ORAL_STRIP | Status: AC

## 2011-05-31 NOTE — Telephone Encounter (Signed)
Patient notified as instructed by telephone. 

## 2011-05-31 NOTE — Telephone Encounter (Signed)
Pt is asking for a new script for her test strips stating she is to check her blood sugar 6 times a day.  She says she has problems with her diabetes and needs to check that often.  Diagnosis will need to be included on script, no more than 6 refills at a time, per medicare.  Uses [facility].

## 2011-05-31 NOTE — Assessment & Plan Note (Signed)
Suspected cellulitis of R leg after nevus removal in a diabetic Surgical site looks overall good - but she has diffuse redness and swelling up to mid calf (not inv foot) Of note also venous insuff tx with keflex  Elevate  Disc red flags to watch for closely -- see inst F/u next week for re check  Will also forward to Dr Terri Piedra

## 2011-05-31 NOTE — Telephone Encounter (Signed)
Let her know that medicare may or may not pay for testing that often- we have disc this before Will refill electronically

## 2011-06-04 ENCOUNTER — Other Ambulatory Visit: Payer: Self-pay | Admitting: Family Medicine

## 2011-06-04 NOTE — Telephone Encounter (Signed)
Received a phone call from patient regarding a refill on her test strips. Chart shows that refill was sent to pharmacy 05/31/2011. Called and was advised by Noreene Larsson that the medical supply part of the pharmacy did not received the refill. Called back and spoke with the pharmacist (April) and was advised that they received it and forwarded it to the medical supply department and she had just faxed it to them again. Spoke to [place] and was advised that she finally received the refill and will be working on this for the patient. Patient advised that they have the refill and will be working on it and if she has any further questions to call and speak with Noreene Larsson.

## 2011-06-05 ENCOUNTER — Other Ambulatory Visit: Payer: Self-pay | Admitting: Family Medicine

## 2011-06-08 ENCOUNTER — Ambulatory Visit (INDEPENDENT_AMBULATORY_CARE_PROVIDER_SITE_OTHER): Payer: Medicare Other | Admitting: Family Medicine

## 2011-06-08 ENCOUNTER — Encounter: Payer: Self-pay | Admitting: Family Medicine

## 2011-06-08 VITALS — BP 148/78 | HR 92 | Temp 97.9°F | Wt 104.0 lb

## 2011-06-08 DIAGNOSIS — R609 Edema, unspecified: Secondary | ICD-10-CM

## 2011-06-08 DIAGNOSIS — L03119 Cellulitis of unspecified part of limb: Secondary | ICD-10-CM

## 2011-06-08 DIAGNOSIS — L02419 Cutaneous abscess of limb, unspecified: Secondary | ICD-10-CM

## 2011-06-08 NOTE — Progress Notes (Signed)
Subjective:    Patient ID: Michelle Blair, female    DOB: 09-07-1920, 77 y.o.   MRN: [id number]  HPI Here for re check of her R leg-- was seen with presumed cellulitis after a mole was removed , and given keflex  inst to elevate it and update if worse or changes  She thinks strongly that she was allergic to steroid cream that Dr Terri Piedra gave her   Leg is still burning - and hand cream helps  Has stabbing pains in the leg - all in the leg area  occ takes a tylenol  Overall is slightly better -- not nearly as red  Wound itself is hard to see - does leave it open at home   Is completely convinced that she is allergic to the cream  Stopped using the cream earlier in the week   Last a1c 6.8- improved   K was back to normal after regular dosing on 11/7  To busy to elevate her feet - does not get much of a chance to do that   Patient Active Problem List  Diagnoses  . DIABETES MELLITUS, TYPE I  . HYPERLIPIDEMIA  . SYNDROME, CHRONIC PAIN  . MACULAR DEGENERATION  . HYPERTENSION  . VENOUS INSUFFICIENCY  . GERD  . CONSTIPATION  . BLIND LOOP SYNDROME  . OSTEOPOROSIS NOS  . EDEMA LEG  . COMPRESSION FRACTURE, SPINE  . FRACTURE, WRIST, LEFT  . HYPERPARATHYROIDISM, HX OF  . SYNCOPE, HX OF  . Anemia  . Pre-ulcerative corn or callous  . Skin lesion of right leg  . Cellulitis, leg   Past Medical History  Diagnosis Date  . Diabetes mellitus   . GERD (gastroesophageal reflux disease)   . Hyperlipidemia   . Hypertension   . Spinal compression fracture   . Osteoporosis   . Thyroid disease   . Venous insufficiency    Past Surgical History  Procedure Date  . Fracture surgery 07/23/2000    hip, L2 compression fracture    History  Substance Use Topics  . Smoking status: Never Smoker   . Smokeless tobacco: Never Used  . Alcohol Use: No   Family History  Problem Relation Age of Onset  . Cancer Father     colon   Allergies  Allergen Reactions  . Aspirin   . Sulfonamide  Derivatives    Current Outpatient Prescriptions on File Prior to Visit  Medication Sig Dispense Refill  . ACIPHEX 20 MG tablet TAKE ONE TABLET BY MOUTH DAILY AS NEEDED FOR REFLUX  30 each  5  . amLODipine (NORVASC) 5 MG tablet TAKE ONE TABLET BY MOUTH EVERY DAY FOR BLOOD PRESSURE  30 tablet  3  . atorvastatin (LIPITOR) 10 MG tablet TAKE 1 TABLET IN THE EVENING FOR CHOLESTEROL  30 tablet  11  . BD PEN NEEDLE NANO U/F 32G X 4 MM MISC INJECT TWICE DAILY OR AS DIRECTED.  100 each  3  . calcium-vitamin D (OSCAL WITH D 500-200) 500-200 MG-UNIT per tablet Take 600 mg by mouth two times daily       . cephALEXin (KEFLEX) 500 MG capsule Take 1 capsule (500 mg total) by mouth 3 (three) times daily.  30 capsule  0  . fluocinonide (LIDEX) 0.05 % cream Apply 1 application topically 3 (three) times daily as needed.        . furosemide (LASIX) 40 MG tablet TAKE ONE-HALF TABLET BY MOUTH EVERY MORNING FOR FLUID RETENTION  45 tablet  0  .  glucose blood test strip Check blood sugar 6 times daily and as needed for insulin dependent diabetes 250.01  180 each  6  . HUMALOG MIX 75/25 KWIKPEN (75-25) 100 UNIT/ML SUSP INJECT 15 UNITS EACH MORNING AND 5 UNITS EACH EVENING.  15 mL  11  . KLOR-CON M20 20 MEQ tablet TAKE ONE TABLET BY MOUTH DAILY  90 each  2  . Lancets MISC Check blood sugar 6 times daily and as needed for insulin dependent diabetes.       Marland Kitchen lisinopril (PRINIVIL,ZESTRIL) 20 MG tablet TAKE 1 & 1/2 TABLETS BY MOUTH EVERY MORNING  45 tablet  1  . nystatin (MYCOSTATIN) 100000 UNIT/ML suspension One teaspoon swish and swallow three times daily for 7-10 days (until better)       . Pediatric Multivitamins-Iron (FLINTSTONES PLUS IRON) chewable tablet Chew 1 tablet by mouth daily.        . vitamin E 400 UNIT capsule Take 400 Units by mouth daily.                Review of Systems Review of Systems  Constitutional: Negative for fever, appetite change, fatigue and unexpected weight change.  Eyes: Negative for  pain and visual disturbance.  Respiratory: Negative for cough and shortness of breath.   Cardiovascular: Negative for cp or palpitations    Gastrointestinal: Negative for nausea, diarrhea and constipation.  Genitourinary: Negative for urgency and frequency.  Skin: Negative for pallor or rash  pos for redness of skin on leg  Neurological: Negative for weakness, light-headedness, numbness and headaches.  Hematological: Negative for adenopathy. Does not bruise/bleed easily.  Psychiatric/Behavioral: Negative for dysphoric mood. The patient has some mild anxiety         Objective:   Physical Exam  Constitutional: She appears well-developed and well-nourished. No distress.  HENT:  Head: Normocephalic and atraumatic.  Mouth/Throat: Oropharynx is clear and moist.  Eyes: Conjunctivae and EOM are normal. Pupils are equal, round, and reactive to light.  Neck: Normal range of motion. Neck supple.  Cardiovascular: Normal rate, regular rhythm, normal heart sounds and intact distal pulses.   Pulmonary/Chest: Effort normal and breath sounds normal. No respiratory distress. She has no wheezes.  Musculoskeletal: She exhibits edema and tenderness.       R shin area - redness 1/2 way up -- with mild swelling- overall improved from last visit  Mildly tender overall  Wound from her mole removal is healing well with granulation tissue and scant 1-2 mm redness surrounding    Lymphadenopathy:    She has no cervical adenopathy.  Neurological: She is alert. She exhibits normal muscle tone. Coordination normal.  Skin: Skin is warm and dry. No rash noted. There is erythema. No pallor.  Psychiatric: She has a normal mood and affect.          Assessment & Plan:

## 2011-06-08 NOTE — Assessment & Plan Note (Signed)
Here for re check - redness and swelling appear to be improved - and wound looks to be healing well  But pt feels like it is more painful in general and also certain that the steroid cream she used caused a rxn She was inst to finish keflex/ elevate leg (she does not tolerate supp stockings) and f/u with Dr Dorita Sciara office for a re check  ? Whether this was true cellulitis vs more trouble with venous stasis dermatitis

## 2011-06-08 NOTE — Patient Instructions (Signed)
We will refer you to Dr Dorita Sciara office for a re check of your leg  Overall it is looking better - but the fact that is is hurting more concerns me I think the wound looks good  Keep it clean Elevate your leg as much as you can  Do not use the steroid cream if you think you are allergic to it

## 2011-06-08 NOTE — Assessment & Plan Note (Signed)
See cellulitis comment above - unsure if leg swelling / redness are from infx vs venous stasis  Pt stopped steroid cream because she thought it made her worse

## 2011-06-13 ENCOUNTER — Other Ambulatory Visit: Payer: Self-pay | Admitting: Family Medicine

## 2011-06-22 ENCOUNTER — Telehealth: Payer: Self-pay | Admitting: Family Medicine

## 2011-06-22 NOTE — Telephone Encounter (Signed)
Needs form for new handicap card.  Please call back [phone]

## 2011-06-29 NOTE — Telephone Encounter (Signed)
Form for handicap placard on shelf in the in box. Left pt v/m to call back.

## 2011-06-29 NOTE — Telephone Encounter (Signed)
Michelle Blair, has this been done?  I can put one in Dr. Royden Purl box to sign if I need to.

## 2011-07-02 NOTE — Telephone Encounter (Signed)
Patient notified as instructed by telephone. Pt request completed form mailed to her h ome address.

## 2011-07-02 NOTE — Telephone Encounter (Signed)
Done and  In IN box

## 2011-07-05 ENCOUNTER — Other Ambulatory Visit: Payer: Self-pay | Admitting: Family Medicine

## 2011-08-02 ENCOUNTER — Other Ambulatory Visit: Payer: Self-pay | Admitting: Family Medicine

## 2011-08-02 NOTE — Telephone Encounter (Signed)
kmart Joiner request Amlodipine 5 mg #30 x 11.

## 2011-08-09 ENCOUNTER — Encounter: Payer: Self-pay | Admitting: Family Medicine

## 2011-08-09 ENCOUNTER — Ambulatory Visit (INDEPENDENT_AMBULATORY_CARE_PROVIDER_SITE_OTHER): Payer: Medicare Other | Admitting: Family Medicine

## 2011-08-09 VITALS — BP 120/80 | HR 102 | Temp 98.8°F | Ht 65.0 in | Wt 103.1 lb

## 2011-08-09 DIAGNOSIS — N390 Urinary tract infection, site not specified: Secondary | ICD-10-CM

## 2011-08-09 DIAGNOSIS — R300 Dysuria: Secondary | ICD-10-CM

## 2011-08-09 LAB — POCT URINALYSIS DIPSTICK
Bilirubin, UA: NEGATIVE
Ketones, UA: NEGATIVE
pH, UA: 7

## 2011-08-09 MED ORDER — CIPROFLOXACIN HCL 250 MG PO TABS: 250.0000 mg | ORAL_TABLET | Freq: Two times a day (BID) | ORAL | Status: AC

## 2011-08-09 NOTE — Progress Notes (Signed)
Going to the bathroom, drinking every fifteen minutes.  Still started this morning. Pain and burning with going to the bathroom. Uses a high commode seat.   Patient presents with burning, urgency. No vaginal discharge or external irritation.  No STD exposure. No abd pain, no flank pain.  ROS: GEN:  no fevers, chills. GI: No n/v/d, eating normally Otherwise, ROS is as per the HPI.  PHYSICAL EXAM  Blood pressure 120/80, pulse 102, temperature 98.8 F (37.1 C), temperature source Oral, height 5\' 5"  (1.651 m), weight 103 lb 1.9 oz (46.775 kg), SpO2 98.00%.  GEN: WDWN, A&Ox4,NAD. Non-toxic HEENT: Atraumatc, normocephalic. CV: RRR, No M/G/R PULM: CTA B, No wheezes, crackles, or rhonchi ABD: S, NT, ND, +BS, no rebound. No CVAT. No suprapubic tenderness. EXT: No c/c/e  A/P: UTI. Rx with ABX as below

## 2011-09-13 ENCOUNTER — Other Ambulatory Visit: Payer: Self-pay | Admitting: Family Medicine

## 2011-09-13 NOTE — Telephone Encounter (Signed)
[facility] request Lisinopril 20 mg #45 x 11.

## 2011-10-30 ENCOUNTER — Telehealth: Payer: Self-pay | Admitting: Family Medicine

## 2011-10-30 NOTE — Telephone Encounter (Signed)
Michelle Blair is calling to get authorization for assisted living. Pls call.

## 2011-10-30 NOTE — Telephone Encounter (Signed)
Michelle Blair as instructed via telephone, appt scheduled for 11/05/2011 and they will bring fl2 form.

## 2011-10-30 NOTE — Telephone Encounter (Signed)
That is fine - let me know what I need to do and where she is going

## 2011-10-30 NOTE — Telephone Encounter (Signed)
It is easier for me to do the FL2 at her visit (they need to bring it) - and then we can also get her labs done, etc- so please schedule appt when able ( would be great , but if that is not possible will try to do in a 15 minute slot)  thanks

## 2011-10-30 NOTE — Telephone Encounter (Signed)
Spoke with Michelle Blair and he stated that they have not picked a facility because they need an FL2 form before any facility will take her.  Also, patient is due for a1c labs they want to know if patient needs to see Dr. Milinda Antis to fill out FL2 form and get labs done or will Dr. Milinda Antis fill out [facility] without patient having an appt?  Please advise.

## 2011-11-05 ENCOUNTER — Ambulatory Visit (INDEPENDENT_AMBULATORY_CARE_PROVIDER_SITE_OTHER): Payer: Medicare Other | Admitting: Family Medicine

## 2011-11-05 ENCOUNTER — Encounter: Payer: Self-pay | Admitting: Family Medicine

## 2011-11-05 VITALS — BP 132/70 | HR 62 | Temp 98.0°F | Ht 65.0 in | Wt 102.8 lb

## 2011-11-05 DIAGNOSIS — I10 Essential (primary) hypertension: Secondary | ICD-10-CM

## 2011-11-05 DIAGNOSIS — E109 Type 1 diabetes mellitus without complications: Secondary | ICD-10-CM

## 2011-11-05 DIAGNOSIS — Z862 Personal history of diseases of the blood and blood-forming organs and certain disorders involving the immune mechanism: Secondary | ICD-10-CM

## 2011-11-05 DIAGNOSIS — E785 Hyperlipidemia, unspecified: Secondary | ICD-10-CM

## 2011-11-05 NOTE — Patient Instructions (Signed)
Labs today  Come back for your PPD (TB skin test) when you need it Here is your FL2 form  Let me know if any problems  Follow up in 6 months  You are allowed to eat a little more to keep sugars higher -- 140s-150s is ok

## 2011-11-05 NOTE — Progress Notes (Signed)
Subjective:    Patient ID: Michelle Blair, female    DOB: 1921/01/21, 78 y.o.   MRN: [id number]  HPI Here for f/u of chronic conditions and also to fill out FL2 form for placement Has been doing fairly  Thinks she is having a little more difficulty with independence and mobility  Getting harder to do everything she needs to do  More forgetful- but not forgetting her medicine Making meals is more of a chore , doing dishes   Is looking at going to assisted living - interested in getting some extra help when she needs to  Needs safety See FL2 for details  Pt is in agreement    Diabetes type 1 Insulin has not changed Home sugar results - checks frequently to guard against hypoglycemia -  Is not having trouble seeing her monitor  DM diet - is excellent as usual - very careful  Exercise - as needed  Symptoms-none  A1C last was 6.8- very good control  No problems with medications  Renal protection- on ace  Last eye exam 3/12  Patient Active Problem List  Diagnoses  . DIABETES MELLITUS, TYPE I  . HYPERLIPIDEMIA  . SYNDROME, CHRONIC PAIN  . MACULAR DEGENERATION  . HYPERTENSION  . VENOUS INSUFFICIENCY  . GERD  . CONSTIPATION  . BLIND LOOP SYNDROME  . OSTEOPOROSIS NOS  . EDEMA LEG  . COMPRESSION FRACTURE, SPINE  . FRACTURE, WRIST, LEFT  . HYPERPARATHYROIDISM, HX OF  . SYNCOPE, HX OF  . Anemia  . Pre-ulcerative corn or callous  . Skin lesion of right leg  . Cellulitis, leg   Past Medical History  Diagnosis Date  . Diabetes mellitus   . GERD (gastroesophageal reflux disease)   . Hyperlipidemia   . Hypertension   . Spinal compression fracture   . Osteoporosis   . Thyroid disease   . Venous insufficiency    Past Surgical History  Procedure Date  . Fracture surgery 07/23/2000    hip, L2 compression fracture    History  Substance Use Topics  . Smoking status: Never Smoker   . Smokeless tobacco: Never Used  . Alcohol Use: No   Family History  Problem  Relation Age of Onset  . Cancer Father     colon   Allergies  Allergen Reactions  . Aspirin   . Sulfonamide Derivatives    Current Outpatient Prescriptions on File Prior to Visit  Medication Sig Dispense Refill  . ACIPHEX 20 MG tablet TAKE ONE TABLET BY MOUTH DAILY AS NEEDED FOR REFLUX  30 each  5  . amLODipine (NORVASC) 5 MG tablet TAKE ONE TABLET BY MOUTH EVERY DAY FOR BLOOD PRESSURE  30 tablet  11  . atorvastatin (LIPITOR) 10 MG tablet TAKE 1 TABLET IN THE EVENING FOR CHOLESTEROL  30 tablet  11  . BD PEN NEEDLE NANO U/F 32G X 4 MM MISC INJECT TWICE DAILY OR AS DIRECTED.  100 each  3  . calcium-vitamin D (OSCAL WITH D 500-200) 500-200 MG-UNIT per tablet Take 600 mg by mouth two times daily       . fluocinonide (LIDEX) 0.05 % cream Apply 1 application topically 3 (three) times daily as needed.        . furosemide (LASIX) 40 MG tablet TAKE ONE-HALF TABLET BY MOUTH EVERY MORNING FOR FLUID RETENTION  45 tablet  11  . glucose blood test strip Check blood sugar 6 times daily and as needed for insulin dependent diabetes 250.01  180 each  6  . HUMALOG MIX 75/25 KWIKPEN (75-25) 100 UNIT/ML SUSP INJECT 15 UNITS EACH MORNING AND 5 UNITS EACH EVENING.  15 mL  11  . KLOR-CON M20 20 MEQ tablet TAKE ONE TABLET BY MOUTH DAILY  90 each  2  . Lancets MISC Check blood sugar 6 times daily and as needed for insulin dependent diabetes.       Marland Kitchen lisinopril (PRINIVIL,ZESTRIL) 20 MG tablet TAKE 1 & 1/2 TABLETS BY MOUTH EVERY MORNING  45 tablet  11  . nystatin (MYCOSTATIN) 100000 UNIT/ML suspension One teaspoon swish and swallow three times daily for 7-10 days (until better)       . Pediatric Multivitamins-Iron (FLINTSTONES PLUS IRON) chewable tablet Chew 1 tablet by mouth daily.        . vitamin E 400 UNIT capsule Take 400 Units by mouth daily.              Review of Systems Review of Systems  Constitutional: Negative for fever, appetite change, fatigue and unexpected weight change.  Eyes: Negative for pain  and pos for loss of visoin .  Respiratory: Negative for cough and shortness of breath.   Cardiovascular: Negative for cp or palpitations    Gastrointestinal: Negative for nausea, diarrhea and constipation.  Genitourinary: Negative for urgency and frequency. pos for occ incontinence of urine  Skin: Negative for pallor or rash   MSK pos for arthritis aches and pains Neurological: Negative for weakness, light-headedness, numbness and headaches. pos for poor balance  Hematological: Negative for adenopathy. Does not bruise/bleed easily.  Psychiatric/Behavioral: Negative for dysphoric mood. The patient is not nervous/anxious.          Objective:   Physical Exam  Constitutional: She appears well-developed and well-nourished. No distress.       Frail appearing elderly female    HENT:  Head: Normocephalic and atraumatic.  Mouth/Throat: Oropharynx is clear and moist.  Eyes: Conjunctivae and EOM are normal. Pupils are equal, round, and reactive to light. No scleral icterus.  Neck: Normal range of motion. Neck supple. No JVD present. Carotid bruit is not present. No thyromegaly present.  Cardiovascular: Normal rate, regular rhythm, normal heart sounds and intact distal pulses.  Exam reveals no gallop.   Pulmonary/Chest: Effort normal and breath sounds normal. No respiratory distress. She has no wheezes.       No crackles  Abdominal: Soft. Bowel sounds are normal. She exhibits no distension, no abdominal bruit and no mass. There is no tenderness.  Musculoskeletal: Normal range of motion. She exhibits tenderness. She exhibits no edema.       OA diffusely  Tender feet/ toes - which tend to be hypersensitive   Lymphadenopathy:    She has no cervical adenopathy.  Neurological: She is alert. She has normal reflexes. No cranial nerve deficit. She exhibits normal muscle tone. Coordination normal.       Generally poor balance - req cane and asst for ambulation  Skin: Skin is warm and dry. No rash  noted. No erythema. No pallor.  Psychiatric: She has a normal mood and affect.       Talkative, mentally sharp , cheerful          Assessment & Plan:

## 2011-11-06 LAB — COMPREHENSIVE METABOLIC PANEL
AST: 23 U/L (ref 0–37)
Albumin: 3.8 g/dL (ref 3.5–5.2)
BUN: 16 mg/dL (ref 6–23)
Calcium: 10.4 mg/dL (ref 8.4–10.5)
Chloride: 105 mEq/L (ref 96–112)
Potassium: 4.1 mEq/L (ref 3.5–5.1)
Sodium: 142 mEq/L (ref 135–145)
Total Protein: 7.4 g/dL (ref 6.0–8.3)

## 2011-11-06 LAB — CALCIUM: Calcium: 10.4 mg/dL (ref 8.4–10.5)

## 2011-11-06 LAB — LIPID PANEL
Total CHOL/HDL Ratio: 2
Triglycerides: 52 mg/dL (ref 0.0–149.0)

## 2011-11-07 ENCOUNTER — Encounter: Payer: Self-pay | Admitting: *Deleted

## 2011-11-07 NOTE — Assessment & Plan Note (Signed)
Overall stable per pt  She is overly concerned about control - explained the dangers of hypoglycemia  Eating less with age - disc proper nutrition a1c today Explained higher sugars were ok - validated that with her son who is present today

## 2011-11-07 NOTE — Assessment & Plan Note (Signed)
bp in fair control at this time  No changes needed  Disc lifstyle change with low sodium diet and exercise   

## 2011-11-07 NOTE — Assessment & Plan Note (Signed)
Watching calcium level- will ref to surgeon if change  Has been stable  On ca and D

## 2011-11-07 NOTE — Assessment & Plan Note (Signed)
Lab today Has been well controlled Disc goals for lipids and reasons to control them Rev labs with pt- last profile Rev low sat fat diet in detail

## 2011-11-13 ENCOUNTER — Other Ambulatory Visit: Payer: Self-pay | Admitting: Family Medicine

## 2011-11-14 ENCOUNTER — Ambulatory Visit: Payer: Medicare Other | Admitting: Family Medicine

## 2012-04-02 ENCOUNTER — Telehealth: Payer: Self-pay | Admitting: *Deleted

## 2012-04-02 NOTE — Telephone Encounter (Signed)
I do not see on her med list present or past -- is it old or from another doctor? - please ask family member and let me know

## 2012-04-02 NOTE — Telephone Encounter (Signed)
Received refill form for vicodin. I don't see this on her med list. Placed form in your IN box as it appears she is now in SNF and they require written Rx.

## 2012-04-03 NOTE — Telephone Encounter (Signed)
Spoke with patient's daughter in law. Patient is at ALF and they were supposed to prescribe vicodin. She wasn't sure why the request was sent you since you are not her physician there. She advised to disregard and she would contact the ALF.

## 2014-07-21 ENCOUNTER — Telehealth: Payer: Self-pay

## 2014-07-21 NOTE — Telephone Encounter (Signed)
Fayrene FearingJames said pt has moved to [facility] in [place]  And needs to know type of pneumonia vaccine and date given so pt can get prevnar 13 at nursing home. Cherre HugerGave James pneumovax 23 was given on 05/09/2009. Fayrene FearingJames voiced understanding.

## 2014-11-21 DEATH — deceased
